# Patient Record
Sex: Male | Born: 1955 | Race: White | Hispanic: No | Smoking: Current every day smoker
Health system: Southern US, Community
[De-identification: ages and names within clinical notes are randomized; demographics above are authoritative.]

## PROBLEM LIST (undated history)

## (undated) DIAGNOSIS — H269 Unspecified cataract: Secondary | ICD-10-CM

## (undated) DIAGNOSIS — I10 Essential (primary) hypertension: Secondary | ICD-10-CM

## (undated) DIAGNOSIS — K219 Gastro-esophageal reflux disease without esophagitis: Secondary | ICD-10-CM

## (undated) DIAGNOSIS — H3320 Serous retinal detachment, unspecified eye: Secondary | ICD-10-CM

## (undated) DIAGNOSIS — M25522 Pain in left elbow: Secondary | ICD-10-CM

## (undated) HISTORY — PX: LEG SURGERY: SHX1003

## (undated) HISTORY — DX: Unspecified cataract: H26.9

## (undated) HISTORY — DX: Gastro-esophageal reflux disease without esophagitis: K21.9

## (undated) HISTORY — DX: Pain in left elbow: M25.522

## (undated) HISTORY — PX: CATARACT EXTRACTION: SUR2

## (undated) HISTORY — PX: EYE SURGERY: SHX253

## (undated) HISTORY — DX: Essential (primary) hypertension: I10

---

## 2002-11-13 ENCOUNTER — Encounter: Payer: Self-pay | Admitting: Occupational Medicine

## 2002-11-13 ENCOUNTER — Encounter: Admission: RE | Admit: 2002-11-13 | Discharge: 2002-11-13 | Payer: Self-pay | Admitting: Occupational Medicine

## 2004-09-18 ENCOUNTER — Ambulatory Visit (HOSPITAL_COMMUNITY): Admission: RE | Admit: 2004-09-18 | Discharge: 2004-09-18 | Payer: Self-pay | Admitting: Orthopedic Surgery

## 2005-07-23 ENCOUNTER — Ambulatory Visit (HOSPITAL_COMMUNITY): Admission: RE | Admit: 2005-07-23 | Discharge: 2005-07-23 | Payer: Self-pay | Admitting: Ophthalmology

## 2008-06-24 ENCOUNTER — Encounter (INDEPENDENT_AMBULATORY_CARE_PROVIDER_SITE_OTHER): Payer: Self-pay | Admitting: *Deleted

## 2008-06-25 ENCOUNTER — Encounter: Payer: Self-pay | Admitting: Family Medicine

## 2008-06-25 ENCOUNTER — Ambulatory Visit: Payer: Self-pay | Admitting: Family Medicine

## 2008-06-25 DIAGNOSIS — R61 Generalized hyperhidrosis: Secondary | ICD-10-CM

## 2008-06-25 DIAGNOSIS — R030 Elevated blood-pressure reading, without diagnosis of hypertension: Secondary | ICD-10-CM

## 2008-06-25 DIAGNOSIS — J309 Allergic rhinitis, unspecified: Secondary | ICD-10-CM | POA: Insufficient documentation

## 2008-06-25 DIAGNOSIS — R1310 Dysphagia, unspecified: Secondary | ICD-10-CM | POA: Insufficient documentation

## 2008-06-25 HISTORY — DX: Elevated blood-pressure reading, without diagnosis of hypertension: R03.0

## 2008-06-25 HISTORY — DX: Generalized hyperhidrosis: R61

## 2008-06-28 ENCOUNTER — Encounter (INDEPENDENT_AMBULATORY_CARE_PROVIDER_SITE_OTHER): Payer: Self-pay | Admitting: *Deleted

## 2008-07-06 ENCOUNTER — Ambulatory Visit: Payer: Self-pay | Admitting: Family Medicine

## 2008-07-13 ENCOUNTER — Ambulatory Visit: Payer: Self-pay | Admitting: Gastroenterology

## 2008-07-13 DIAGNOSIS — R197 Diarrhea, unspecified: Secondary | ICD-10-CM | POA: Insufficient documentation

## 2008-07-13 DIAGNOSIS — R1319 Other dysphagia: Secondary | ICD-10-CM | POA: Insufficient documentation

## 2008-07-13 DIAGNOSIS — K921 Melena: Secondary | ICD-10-CM | POA: Insufficient documentation

## 2008-07-13 DIAGNOSIS — K219 Gastro-esophageal reflux disease without esophagitis: Secondary | ICD-10-CM | POA: Insufficient documentation

## 2008-07-13 HISTORY — DX: Melena: K92.1

## 2008-07-13 HISTORY — DX: Other dysphagia: R13.19

## 2008-07-13 HISTORY — DX: Diarrhea, unspecified: R19.7

## 2008-07-22 ENCOUNTER — Ambulatory Visit: Payer: Self-pay | Admitting: Family Medicine

## 2008-07-22 DIAGNOSIS — R011 Cardiac murmur, unspecified: Secondary | ICD-10-CM

## 2008-07-22 DIAGNOSIS — F17200 Nicotine dependence, unspecified, uncomplicated: Secondary | ICD-10-CM

## 2008-08-02 ENCOUNTER — Encounter: Payer: Self-pay | Admitting: Family Medicine

## 2008-08-02 ENCOUNTER — Ambulatory Visit: Payer: Self-pay

## 2008-08-04 ENCOUNTER — Encounter: Payer: Self-pay | Admitting: Gastroenterology

## 2008-08-04 ENCOUNTER — Ambulatory Visit: Payer: Self-pay | Admitting: Gastroenterology

## 2008-08-06 ENCOUNTER — Encounter: Payer: Self-pay | Admitting: Gastroenterology

## 2008-08-14 ENCOUNTER — Encounter: Payer: Self-pay | Admitting: Gastroenterology

## 2010-07-07 NOTE — Op Note (Signed)
Miguel Mills, Miguel Mills           ACCOUNT NO.:  [id number]   MEDICAL RECORD NO.:  [id number]          PATIENT TYPE:  AMB   LOCATION:  SDS                          FACILITY:  [facility]   PHYSICIAN:  Alford Highland. Rankin, M.D.   DATE OF BIRTH:  1956-01-15   DATE OF PROCEDURE:  07/23/2005  DATE OF DISCHARGE:                                 OPERATIVE REPORT   PREOPERATIVE DIAGNOSIS:  Rhegmatogenous retinal detachment, left eye, macula  on.   POSTOPERATIVE DIAGNOSIS:  Rhegmatogenous retinal detachment, left eye,  macula on.   PROCEDURES:  1.  Scleral buckle and retinal cryopexy, left eye.  2.  External drainage of subretinal fluid, left eye.   SURGEON:  Alford Highland. Rankin, M.D.   ANESTHESIA:  General endotracheal anesthesia.   INDICATION FOR PROCEDURE:  The patient is a 55 year old man who has profound  vision loss on the basis of a rhegmatogenous retinal detachment with  superior visual field loss.  This is actually asymptomatic and found on a  routine eye examination.   The patient understands the need for surgical intervention in order to  prevent vision loss and preserve current visual function.  He understands  the risks of anesthesia, including the rare occurrence of death, and losses  to the eye, including but not limited to hemorrhage, infection, scarring,  need for further surgery, no change in vision, loss of vision, and  progressive disease despite intervention.   An appropriate signed consent was obtained.  The patient was taken to the  operating room.  In the operating room appropriate monitoring was followed  by mild sedation.  General endotracheal anesthesia was instituted without  difficulty.  The left periocular region was sterilely prepped and draped in  the usual ophthalmic fashion.  A lid speculum applied.  A conjunctival  peritomy was then fashioned 360 degrees.  A relaxing incision was made in  two quadrants.  Ophthalmoscopy was then performed __________ the retinal  hole at the 5 o'clock position.  Retinal cryopexy was applied in this area.  The retinal detachment extended from the 3 o'clock to the 6:30 position.  A  287 solid silicone explant was then selected and placed in the  inferotemporal quadrant.  A 240 encircling band was used and joined end-to-  end in the superonasal quadrant with a 70 Watzke sleeve.   Appropriate tension was applied.  External drainage of subretinal fluid was  carried out in the bed of the buckle.   At this time all fluid was drained without difficulty under direct  visualization.   No complications occurred.  The buckle was then secured with horizontal  mattress sutures, 2 in the inferotemporal quadrant, 1 in the remaining  quadrants.   At this time appropriate tension was applied.  The bed of the buckle was  irrigated with bug juice.  At that time the Tenon's brought forward and  closed with interrupted 7-0 Vicryl.  Subconjunctival injection of antibiotic  and steroid applied.  Sterile patch and Fox shield applied.  The patient  awakened from anesthesia and was taken to the recovery room in good, stable  condition.  No  complication occurred.      Alford Highland Rankin, M.D.  Electronically Signed     GAR/MEDQ  D:  07/23/2005  T:  07/24/2005  Job:  [id number]   cc:   Baldo Ash, O.D.

## 2011-05-23 ENCOUNTER — Encounter: Payer: Self-pay | Admitting: Gastroenterology

## 2012-05-23 ENCOUNTER — Encounter: Payer: Self-pay | Admitting: Gastroenterology

## 2014-07-13 ENCOUNTER — Encounter: Payer: Self-pay | Admitting: Gastroenterology

## 2015-11-26 ENCOUNTER — Encounter (HOSPITAL_COMMUNITY): Payer: Self-pay | Admitting: *Deleted

## 2015-11-26 ENCOUNTER — Emergency Department (HOSPITAL_COMMUNITY)
Admission: EM | Admit: 2015-11-26 | Discharge: 2015-11-26 | Disposition: A | Discharge status: Home / Self Care | Payer: BLUE CROSS/BLUE SHIELD | Attending: Emergency Medicine | Admitting: Emergency Medicine

## 2015-11-26 ENCOUNTER — Emergency Department (HOSPITAL_COMMUNITY): Payer: BLUE CROSS/BLUE SHIELD

## 2015-11-26 DIAGNOSIS — J069 Acute upper respiratory infection, unspecified: Secondary | ICD-10-CM | POA: Insufficient documentation

## 2015-11-26 DIAGNOSIS — F17200 Nicotine dependence, unspecified, uncomplicated: Secondary | ICD-10-CM | POA: Insufficient documentation

## 2015-11-26 DIAGNOSIS — R0602 Shortness of breath: Secondary | ICD-10-CM | POA: Diagnosis present

## 2015-11-26 HISTORY — DX: Serous retinal detachment, unspecified eye: H33.20

## 2015-11-26 LAB — BASIC METABOLIC PANEL
Anion gap: 13 (ref 5–15)
CALCIUM: 9.7 mg/dL (ref 8.9–10.3)
CHLORIDE: 102 mmol/L (ref 101–111)
CO2: 24 mmol/L (ref 22–32)
CREATININE: 0.63 mg/dL (ref 0.61–1.24)
GFR calc non Af Amer: >60 mL/min (ref >60)
GLUCOSE: 116 mg/dL — AB (ref 65–99)
Potassium: 3.7 mmol/L (ref 3.5–5.1)
Sodium: 139 mmol/L (ref 135–145)

## 2015-11-26 LAB — CBC
HCT: 44 % (ref 39.0–52.0)
Hemoglobin: 15.3 g/dL (ref 13.0–17.0)
MCH: 31.7 pg (ref 26.0–34.0)
MCHC: 34.8 g/dL (ref 30.0–36.0)
MCV: 91.3 fL (ref 78.0–100.0)
PLATELETS: 245 10*3/uL (ref 150–400)
RBC: 4.82 MIL/uL (ref 4.22–5.81)
RDW: 12.8 % (ref 11.5–15.5)
WBC: 6.8 10*3/uL (ref 4.0–10.5)

## 2015-11-26 LAB — I-STAT TROPONIN, ED: Troponin i, poc: 0.01 ng/mL (ref 0.00–0.08)

## 2015-11-26 MED ORDER — DM-GUAIFENESIN ER 30-600 MG PO TB12
1.0000 | ORAL_TABLET | Freq: Two times a day (BID) | ORAL | Status: DC
  Administered 2015-11-26: 1 via ORAL
  Filled 2015-11-26: qty 1

## 2015-11-26 MED ORDER — AZITHROMYCIN 250 MG PO TABS
500.0000 mg | ORAL_TABLET | Freq: Every day | ORAL | Status: DC
  Administered 2015-11-26: 500 mg via ORAL
  Filled 2015-11-26: qty 2

## 2015-11-26 MED ORDER — AZITHROMYCIN 250 MG PO TABS: 250.0000 mg | ORAL_TABLET | Freq: Every day | ORAL | 0 refills | Status: DC

## 2015-11-26 MED ORDER — ALBUTEROL SULFATE HFA 108 (90 BASE) MCG/ACT IN AERS: 1.0000 | INHALATION_SPRAY | RESPIRATORY_TRACT | Status: DC | PRN

## 2015-11-26 NOTE — ED Notes (Signed)
Pt stable, ambulatory, states understanding of discharge instructions 

## 2015-11-26 NOTE — ED Provider Notes (Signed)
[facility] DEPT Provider Note   CSN: [id number] Arrival date & time: 11/26/15  0053  History   Chief Complaint Chief Complaint  Patient presents with  . Nasal Congestion  . Shortness of Breath    HPI Miguel Mills is a 60 y.o. male.  HPI   Dysphagia, diarrhea, tobacco, systolic murmur, GERD, rhinitis, night swears, elevated BP, elevated BP wo hypertension:  Patient to the ER for evaluating of nasal congestion, cough, and chest congestion with some mild SOB for 2 days. He says that he come to the ER tonight because he started coughing and was unable to stop. He says that while he was in the waiting room his coughing eventually subsided and improved. He is now just complaining of nasal congestion, He no longer has shortness of breath or congestion.  He denies fevers, chills, weight loss, bone aches, back pain, headache, CP, confusion, LE swelling, or night sweats.  Past Medical History:  Diagnosis Date  . Retinal detachment     Patient Active Problem List   Diagnosis Date Noted  . TOBACCO USE 07/22/2008  . SYSTOLIC MURMUR [id number]  . GERD 07/13/2008  . BLOOD IN STOOL 07/13/2008  . OTHER DYSPHAGIA 07/13/2008  . DIARRHEA 07/13/2008  . RHINITIS 06/25/2008  . NIGHT SWEATS 06/25/2008  . DYSPHAGIA UNSPECIFIED 06/25/2008  . ELEVATED BP W/O HYPERTENSION 06/25/2008    Past Surgical History:  Procedure Laterality Date  . EYE SURGERY    . LEG SURGERY         Home Medications    Prior to Admission medications   Medication Sig Start Date End Date Taking? Authorizing Provider  azithromycin (ZITHROMAX) 250 MG tablet Take 1 tablet (250 mg total) by mouth daily. Take first 2 tablets together, then 1 every day until finished. 11/26/15   Delos Haring, PA-C    Family History No family history on file.  Social History Social History  Substance Use Topics  . Smoking status: Current Every Day Smoker  . Smokeless tobacco: Never Used  . Alcohol use Yes      Allergies   Review of patient's allergies indicates no known allergies.   Review of Systems Review of Systems  Review of Systems All other systems negative except as documented in the HPI. All pertinent positives and negatives as reviewed in the HPI.  Physical Exam Updated Vital Signs BP 129/90   Pulse 95   Temp 98.7 F (37.1 C) (Oral)   Resp 15   SpO2 96%   Physical Exam  Constitutional: He appears well-developed and well-nourished. No distress.  HENT:  Head: Normocephalic and atraumatic.  Right Ear: Tympanic membrane and ear canal normal.  Left Ear: Tympanic membrane and ear canal normal.  Nose: Nose normal.  Mouth/Throat: Uvula is midline, oropharynx is clear and moist and mucous membranes are normal.  Eyes: Pupils are equal, round, and reactive to light.  Neck: Normal range of motion. Neck supple.  Cardiovascular: Normal rate and regular rhythm.   Pulmonary/Chest: Effort normal.  Abdominal: Soft.  No signs of abdominal distention  Musculoskeletal:  No LE swelling  Neurological: He is alert.  Acting at baseline  Skin: Skin is warm and dry. No rash noted.  Nursing note and vitals reviewed.    ED Treatments / Results  Labs (all labs ordered are listed, but only abnormal results are displayed) Labs Reviewed  BASIC METABOLIC PANEL - Abnormal; Notable for the following:       Result Value   Glucose, Bld 116 (*)  BUN <5 (*)    All other components within normal limits  CBC  I-STAT TROPOININ, ED    EKG  EKG Interpretation None       Radiology Dg Chest 2 View  Result Date: 11/26/2015 CLINICAL DATA:  Cough, congestion, and shortness of breath for 2 days. Smoker. EXAM: CHEST  2 VIEW COMPARISON:  06/25/2008 FINDINGS: Emphysematous changes and scattered fibrosis in the lungs. No focal airspace disease or consolidation. Normal heart size and pulmonary vascularity. No blunting of costophrenic angles. No pneumothorax. Old left rib fractures. IMPRESSION:  Emphysematous changes and fibrosis in the lungs. No evidence of active pulmonary disease. Electronically Signed   By: Lucienne Capers M.D.   On: 11/26/2015 02:01    Procedures Procedures (including critical care time)  Medications Ordered in ED Medications  albuterol (PROVENTIL HFA;VENTOLIN HFA) 108 (90 Base) MCG/ACT inhaler 1 puff (not administered)  azithromycin (ZITHROMAX) tablet 500 mg (not administered)  dextromethorphan-guaiFENesin (MUCINEX DM) 30-600 MG per 12 hr tablet 1 tablet (not administered)     Initial Impression / Assessment and Plan / ED Course  I have reviewed the triage vital signs and the nursing notes.  Pertinent labs & imaging results that were available during my care of the patient were reviewed by me and considered in my medical decision making (see chart for details).  Clinical Course    Pt denies having hx of emphysema, his chest xray shows these changes today. Discussed this with him and that he needs to get plugged into a primary care provider for further evaluation.  Will give decongestant, ABX and albuterol inhaler, pt is well appearing, discussed return precautions.  Final Clinical Impressions(s) / ED Diagnoses   Final diagnoses:  Upper respiratory tract infection, unspecified type    New Prescriptions New Prescriptions   AZITHROMYCIN (ZITHROMAX) 250 MG TABLET    Take 1 tablet (250 mg total) by mouth daily. Take first 2 tablets together, then 1 every day until finished.     Delos Haring, PA-C 11/26/15 [id number]    Lacretia Leigh, MD 11/27/15 [phone]

## 2015-11-26 NOTE — ED Triage Notes (Signed)
Pt is traveling to [place], has had cold like symptoms and congestion x 1 week. Reports difficulty breathing x 2 days. Denies chest pain

## 2015-11-26 NOTE — ED Notes (Signed)
Pt decided not to leave. He has stayed in the waiting room.

## 2016-06-05 ENCOUNTER — Ambulatory Visit (HOSPITAL_COMMUNITY)
Admission: EM | Admit: 2016-06-05 | Discharge: 2016-06-05 | Disposition: A | Discharge status: Home / Self Care | Payer: BLUE CROSS/BLUE SHIELD | Attending: Internal Medicine | Admitting: Internal Medicine

## 2016-06-05 ENCOUNTER — Encounter (HOSPITAL_COMMUNITY): Payer: Self-pay | Admitting: Emergency Medicine

## 2016-06-05 DIAGNOSIS — B9789 Other viral agents as the cause of diseases classified elsewhere: Secondary | ICD-10-CM

## 2016-06-05 DIAGNOSIS — J069 Acute upper respiratory infection, unspecified: Secondary | ICD-10-CM | POA: Diagnosis not present

## 2016-06-05 DIAGNOSIS — J302 Other seasonal allergic rhinitis: Secondary | ICD-10-CM

## 2016-06-05 MED ORDER — FLUTICASONE PROPIONATE 50 MCG/ACT NA SUSP: 2.0000 | Freq: Every day | NASAL | 2 refills | Status: DC

## 2016-06-05 MED ORDER — MONTELUKAST SODIUM 10 MG PO TABS: 10.0000 mg | ORAL_TABLET | Freq: Every day | ORAL | 2 refills | Status: DC

## 2016-06-05 MED ORDER — BENZONATATE 100 MG PO CAPS: 100.0000 mg | ORAL_CAPSULE | Freq: Three times a day (TID) | ORAL | 0 refills | Status: DC

## 2016-06-05 NOTE — ED Triage Notes (Signed)
On thursday felt weak, Friday had diarrhea and congestion in chest.  Seemed to improve over the week end.  Patient continues with congestion in chest and cough

## 2016-06-05 NOTE — Discharge Instructions (Signed)
You most likely have a viral URI, I advise rest, plenty of fluids and management of symptoms with over the counter medicines. For symptoms you may take Tylenol as needed every 4-6 hours for body aches or fever, not to exceed 4,000 mg a day, Take mucinex or mucinex DM ever 12 hours with a full glass of water, you may use an inhaled steroid such as Flonase, 2 sprays each nostril once a day for congestion, or an antihistamine such as Claritin or Zyrtec once a day. For cough, I have prescribed a medication called Tessalon. Take 1 tablet every 8 hours as needed for your cough. Should your symptoms worsen or fail to resolve, follow up with your primary care provider or return to clinic.   For your allergies, I have prescribed Singulair, take one tablet every night at bedtime.

## 2016-06-05 NOTE — ED Provider Notes (Signed)
CSN: [id number]     Arrival date & time 06/05/16  1123 History   First MD Initiated Contact with Patient 06/05/16 1146     Chief Complaint  Patient presents with  . URI   (Consider location/radiation/quality/duration/timing/severity/associated sxs/prior Treatment)  URI  Presenting symptoms: congestion, cough and rhinorrhea   Presenting symptoms: no fever and no sore throat   Congestion:    Location:  Nasal   Interferes with sleep: yes     Interferes with eating/drinking: no   Cough:    Cough characteristics:  Non-productive, dry and hacking   Sputum characteristics:  Clear   Severity:  Moderate   Onset quality:  Gradual   Duration:  4 days   Timing:  Intermittent   Progression:  Waxing and waning   Chronicity:  New Severity:  Mild Onset quality:  Gradual Timing:  Constant Progression:  Unchanged Chronicity:  New Relieved by:  OTC medications and decongestant Worsened by:  Nothing Associated symptoms: sneezing   Associated symptoms: no arthralgias, no headaches, no myalgias, no neck pain, no sinus pain, no swollen glands and no wheezing     Past Medical History:  Diagnosis Date  . Retinal detachment    Past Surgical History:  Procedure Laterality Date  . EYE SURGERY    . LEG SURGERY     No family history on file. Social History  Substance Use Topics  . Smoking status: Current Every Day Smoker  . Smokeless tobacco: Never Used  . Alcohol use Yes    Review of Systems  Constitutional: Negative for chills and fever.  HENT: Positive for congestion, rhinorrhea and sneezing. Negative for sinus pain and sore throat.   Eyes: Positive for redness and itching.  Respiratory: Positive for cough. Negative for shortness of breath and wheezing.   Cardiovascular: Negative for chest pain and palpitations.  Gastrointestinal: Negative.   Genitourinary: Negative.   Musculoskeletal: Negative for arthralgias, myalgias and neck pain.  Skin: Negative.   Neurological: Negative for  dizziness, light-headedness and headaches.    Allergies  Patient has no known allergies.  Home Medications   Prior to Admission medications   Medication Sig Start Date End Date Taking? Authorizing Provider  benzonatate (TESSALON) 100 MG capsule Take 1 capsule (100 mg total) by mouth every 8 (eight) hours. 06/05/16   Barnet Glasgow, NP  fluticasone (FLONASE) 50 MCG/ACT nasal spray Place 2 sprays into both nostrils daily. 06/05/16   Barnet Glasgow, NP  montelukast (SINGULAIR) 10 MG tablet Take 1 tablet (10 mg total) by mouth at bedtime. 06/05/16   Barnet Glasgow, NP   Meds Ordered and Administered this Visit  Medications - No data to display  BP (!) 147/90 (BP Location: Left Arm)   Pulse 86   Temp 98.9 F (37.2 C) (Oral)   Resp 18   SpO2 99%  No data found.   Physical Exam  Constitutional: He is oriented to person, place, and time. He appears well-developed and well-nourished. No distress.  HENT:  Head: Normocephalic and atraumatic.  Right Ear: Tympanic membrane and external ear normal.  Left Ear: Tympanic membrane and external ear normal.  Nose: Nose normal. Right sinus exhibits no maxillary sinus tenderness and no frontal sinus tenderness. Left sinus exhibits no maxillary sinus tenderness and no frontal sinus tenderness.  Mouth/Throat: Uvula is midline and oropharynx is clear and moist. No oropharyngeal exudate.  Eyes: Conjunctivae are normal. Right eye exhibits no discharge. Left eye exhibits no discharge.  Neck: Normal range of motion. Neck supple. No  JVD present.  Cardiovascular: Normal rate and regular rhythm.   Pulmonary/Chest: Effort normal and breath sounds normal. No respiratory distress. He has no wheezes.  Abdominal: Soft. Bowel sounds are normal.  Lymphadenopathy:    He has no cervical adenopathy.  Neurological: He is alert and oriented to person, place, and time.  Skin: Skin is warm and dry. Capillary refill takes less than 2 seconds. No rash noted. He is not  diaphoretic. No erythema.  Psychiatric: He has a normal mood and affect. His behavior is normal.  Nursing note and vitals reviewed.   Urgent Care Course     Procedures (including critical care time)  Labs Review Labs Reviewed - No data to display  Imaging Review No results found.    MDM   1. Viral URI with cough   2. Seasonal allergic rhinitis, unspecified trigger    Counseling provided on over-the-counter therapies for symptom relief. Given prescription for Tessalon for cough, Flonase and Singulair for allergies. Recommend rest, plenty of fluids, follow-up with primary care if symptoms persist.     Barnet Glasgow, NP 06/05/16 1205

## 2018-04-19 ENCOUNTER — Encounter (HOSPITAL_COMMUNITY): Payer: Self-pay | Admitting: Emergency Medicine

## 2018-04-19 ENCOUNTER — Ambulatory Visit (HOSPITAL_COMMUNITY)
Admission: EM | Admit: 2018-04-19 | Discharge: 2018-04-19 | Disposition: A | Discharge status: Home / Self Care | Payer: BLUE CROSS/BLUE SHIELD | Attending: Family Medicine | Admitting: Family Medicine

## 2018-04-19 DIAGNOSIS — J309 Allergic rhinitis, unspecified: Secondary | ICD-10-CM | POA: Diagnosis not present

## 2018-04-19 DIAGNOSIS — S161XXA Strain of muscle, fascia and tendon at neck level, initial encounter: Secondary | ICD-10-CM | POA: Diagnosis not present

## 2018-04-19 DIAGNOSIS — R030 Elevated blood-pressure reading, without diagnosis of hypertension: Secondary | ICD-10-CM

## 2018-04-19 MED ORDER — MELOXICAM 7.5 MG PO TABS: 7.5000 mg | ORAL_TABLET | Freq: Every day | ORAL | 0 refills | Status: DC

## 2018-04-19 MED ORDER — METHOCARBAMOL 500 MG PO TABS
500.0000 mg | ORAL_TABLET | Freq: Two times a day (BID) | ORAL | 0 refills | Status: DC
Start: 2018-04-19 — End: 2021-03-15

## 2018-04-19 MED ORDER — FLUTICASONE PROPIONATE 50 MCG/ACT NA SUSP: 2.0000 | Freq: Every day | NASAL | 2 refills | Status: DC

## 2018-04-19 NOTE — Discharge Instructions (Signed)
Start Mobic. Do not take ibuprofen (motrin/advil)/ naproxen (aleve) while on mobic. Robaxin as needed, this can make you drowsy, so do not take if you are going to drive, operate heavy machinery, or make important decisions. Ice/heat compresses as needed. This can take up to 3-4 weeks to completely resolve, but you should be feeling better each week. Follow up here or with PCP if symptoms worsen, changes for reevaluation.

## 2018-04-19 NOTE — ED Triage Notes (Signed)
Pt states last week he turned his neck while at work really fast trying to look at something, states Wednesday morning he woke up with L sided neck pain and tenderness, difficulty turning his head.

## 2018-04-19 NOTE — ED Provider Notes (Signed)
[facility]    CSN: [id number] Arrival date & time: 04/19/18  1004     History   Chief Complaint Chief Complaint  Patient presents with  . Neck Pain    HPI Miguel Mills is a 63 y.o. male.   63 year old male comes in for few day history of left neck pain. Patient states about 1 week ago, turned his neck really fast when he thought something was falling from the top of a shelf. He had no pain at that time. However, a few days ago, started having gradual neck pain that is causing stiffness and difficulty turning. He states pain can be both left and right neck at the time. He denies numbness/tingling, loss of grip strength. He has since been taking ibuprofen intermittently with some relief. He states now able to have full ROM of neck, though with pain when he turns his head.      Past Medical History:  Diagnosis Date  . Retinal detachment     Patient Active Problem List   Diagnosis Date Noted  . TOBACCO USE 07/22/2008  . SYSTOLIC MURMUR 83/41/9622  . GERD 07/13/2008  . BLOOD IN STOOL 07/13/2008  . OTHER DYSPHAGIA 07/13/2008  . DIARRHEA 07/13/2008  . RHINITIS 06/25/2008  . NIGHT SWEATS 06/25/2008  . DYSPHAGIA UNSPECIFIED 06/25/2008  . ELEVATED BP W/O HYPERTENSION 06/25/2008    Past Surgical History:  Procedure Laterality Date  . EYE SURGERY    . LEG SURGERY         Home Medications    Prior to Admission medications   Medication Sig Start Date End Date Taking? Authorizing Provider  fluticasone (FLONASE) 50 MCG/ACT nasal spray Place 2 sprays into both nostrils daily. 04/19/18   Tasia Catchings, Brandyce Dimario V, PA-C  meloxicam (MOBIC) 7.5 MG tablet Take 1 tablet (7.5 mg total) by mouth daily. 04/19/18   Tasia Catchings, Nashaly Dorantes V, PA-C  methocarbamol (ROBAXIN) 500 MG tablet Take 1 tablet (500 mg total) by mouth 2 (two) times daily. 04/19/18   Tasia Catchings, Diala Waxman V, PA-C  montelukast (SINGULAIR) 10 MG tablet Take 1 tablet (10 mg total) by mouth at bedtime. 06/05/16   Barnet Glasgow, NP    Family  History No family history on file.  Social History Social History   Tobacco Use  . Smoking status: Current Every Day Smoker  . Smokeless tobacco: Never Used  Substance Use Topics  . Alcohol use: Yes  . Drug use: No     Allergies   Patient has no known allergies.   Review of Systems Review of Systems  Reason unable to perform ROS: See HPI as above.     Physical Exam Triage Vital Signs ED Triage Vitals  Enc Vitals Group     BP 04/19/18 1022 (!) 184/97     Pulse Rate 04/19/18 1022 77     Resp 04/19/18 1022 18     Temp 04/19/18 1022 98.4 F (36.9 C)     Temp src --      SpO2 04/19/18 1022 99 %     Weight --      Height --      Head Circumference --      Peak Flow --      Pain Score 04/19/18 1024 0     Pain Loc --      Pain Edu? --      Excl. in [place]? --    No data found.  Updated Vital Signs BP (!) 184/97   Pulse 77  Temp 98.4 F (36.9 C)   Resp 18   SpO2 99%   Physical Exam Constitutional:      General: He is not in acute distress.    Appearance: He is well-developed. He is not ill-appearing, toxic-appearing or diaphoretic.  HENT:     Head: Normocephalic and atraumatic.  Eyes:     Conjunctiva/sclera: Conjunctivae normal.     Pupils: Pupils are equal, round, and reactive to light.  Neck:     Comments: No tenderness to palpation of spinous processes. Tenderness to palpation of left neck. Full ROM of neck. Strength normal and equal bilaterally.  Musculoskeletal:     Comments: No tenderness to palpation of spinous processes. No tenderness to palpation of bilateral shoulders/thoracic back. Full ROM of shoulders. Strength normal and equal bilaterally. Grip strength normal and equal bilaterally. Sensation intact and equal bilaterally. Radial pulse 2+, cap refill <2s  Neurological:     Mental Status: He is alert and oriented to person, place, and time.      UC Treatments / Results  Labs (all labs ordered are listed, but only abnormal results are  displayed) Labs Reviewed - No data to display  EKG None  Radiology No results found.  Procedures Procedures (including critical care time)  Medications Ordered in UC Medications - No data to display  Initial Impression / Assessment and Plan / UC Course  I have reviewed the triage vital signs and the nursing notes.  Pertinent labs & imaging results that were available during my care of the patient were reviewed by me and considered in my medical decision making (see chart for details).    Start NSAID as directed for pain and inflammation. Muscle relaxant as needed. Ice/heat compresses. Discussed with patient strain can take up to 3-4 weeks to resolve, but should be getting better each week. Return precautions given.   Patient would like his flonase refilled. States has had good relief for his allergic rhinitis. He states with Rx may be cheaper than over the counter. Refills sent to pharmacy.  Final Clinical Impressions(s) / UC Diagnoses   Final diagnoses:  Strain of neck muscle, initial encounter   ED Prescriptions    Medication Sig Dispense Auth. Provider   meloxicam (MOBIC) 7.5 MG tablet Take 1 tablet (7.5 mg total) by mouth daily. 15 tablet Hartleigh Edmonston V, PA-C   methocarbamol (ROBAXIN) 500 MG tablet Take 1 tablet (500 mg total) by mouth 2 (two) times daily. 20 tablet Nanami Whitelaw V, PA-C   fluticasone (FLONASE) 50 MCG/ACT nasal spray Place 2 sprays into both nostrils daily. 15.8 g Miguel Mills, [place] 04/19/18 1112

## 2018-07-28 ENCOUNTER — Ambulatory Visit (HOSPITAL_COMMUNITY)
Admission: EM | Admit: 2018-07-28 | Discharge: 2018-07-28 | Disposition: A | Discharge status: Home / Self Care | Payer: BLUE CROSS/BLUE SHIELD | Attending: Family Medicine | Admitting: Family Medicine

## 2018-07-28 ENCOUNTER — Other Ambulatory Visit: Payer: Self-pay

## 2018-07-28 ENCOUNTER — Encounter (HOSPITAL_COMMUNITY): Payer: Self-pay

## 2018-07-28 DIAGNOSIS — M25532 Pain in left wrist: Secondary | ICD-10-CM | POA: Diagnosis not present

## 2018-07-28 DIAGNOSIS — R030 Elevated blood-pressure reading, without diagnosis of hypertension: Secondary | ICD-10-CM

## 2018-07-28 MED ORDER — NAPROXEN 375 MG PO TABS: 375.0000 mg | ORAL_TABLET | Freq: Two times a day (BID) | ORAL | 0 refills | Status: DC

## 2018-07-28 NOTE — ED Provider Notes (Signed)
[place]   [id number] 07/28/18 Arrival Time: 2355  ASSESSMENT & PLAN:  1. Left wrist pain    Inflammatory. No indication for plain imaging of wrist at this time. No trauma.  Meds ordered this encounter  Medications  . naproxen (NAPROSYN) 375 MG tablet    Sig: Take 1 tablet (375 mg total) by mouth 2 (two) times daily with a meal.    Dispense:  14 tablet    Refill:  0   Orders Placed This Encounter  Procedures  . Splint wrist   To wear splint regularly for the next several days to one week. Remove to shower. Once feeling a little better will remove for short periods of time to work on wrist ROM.  Follow-up Information    Roseanne Kaufman, MD.   Specialty:  Orthopedic Surgery Why:  If not improving over the next week. Contact information: [address] [place] [address] [phone]          Work note with restrictions provided.  Reviewed expectations re: course of current medical issues. Questions answered. Outlined signs and symptoms indicating need for more acute intervention. Patient verbalized understanding. After Visit Summary given.  SUBJECTIVE: History from: patient. Miguel Mills is a 63 y.o. male who reports fairly persistent mild to moderate pain of his left wrist; described as aching and dull; occasional sharp exacerbation; without radiation. Onset: gradual, first noticed yesterday morning after waking. 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 Symptoms have progressed to a point and plateaued since beginning. Aggravating factors: movement. Alleviating factors: holding still. Associated symptoms: none reported. Extremity sensation changes or weakness: none. Self treatment: has not tried OTCs for relief of pain. History of similar: no.  Past Surgical History:  Procedure Laterality Date  . EYE SURGERY    . LEG SURGERY      ROS: As per HPI.  All other systems negative.   OBJECTIVE:  Vitals:   07/28/18 0905  BP: (!) 157/97  Pulse: 90  Resp: 16  Temp: 97.9 F (36.6 C)  TempSrc: Oral  SpO2: 97%    General appearance: alert; no distress HEENT: [place]; AT Neck: supple with FROM Resp: unlabored respirations Extremities: . LUE: warm and well perfused; poorly localized moderate tenderness over left wrist; without gross deformities; with no swelling; with no bruising; ROM: normal with reported discomfort (more discomfort with wrist extension) CV: brisk extremity capillary refill of LUE; 2+ radial pulse of LUE. Skin: warm and dry; no visible rashes Neurologic: gait normal; normal reflexes of RUE and LUE; normal sensation of RUE and LUE; normal strength of RUE and LUE Psychological: alert and cooperative; normal mood and affect  No Known Allergies  Past Medical History:  Diagnosis Date  . Retinal detachment    Social History   Socioeconomic History  . Marital status: Divorced    Spouse name: Not on file  . Number of children: Not on file  . Years of education: Not on file  . Highest education level: Not on file  Occupational History  . Not on file  Social Needs  . Financial resource strain: Not on file  . Food insecurity:    Worry: Not on file    Inability: Not on file  . Transportation needs:    Medical: Not on file    Non-medical: Not on file  Tobacco Use  . Smoking status: Current Every Day Smoker  . Smokeless tobacco: Never Used  Substance and Sexual Activity  . Alcohol use: Yes  . Drug use: No  . Sexual activity: Not on file  Lifestyle  . Physical activity:    Days per week: Not on file    Minutes per session: Not on file  . Stress: Not on file  Relationships  . Social connections:    Talks on phone: Not on file    Gets together: Not on file    Attends religious service: Not on file    Active member of club or organization: Not on file    Attends meetings of clubs or organizations: Not on file     Relationship status: Not on file  Other Topics Concern  . Not on file  Social History Narrative  . Not on file     Past Surgical History:  Procedure Laterality Date  . EYE SURGERY    . LEG SURGERY        Vanessa Kick, MD 07/28/18 1014

## 2018-07-28 NOTE — ED Triage Notes (Signed)
Pt presents with left hand and wrist pain from unknown source.

## 2020-08-06 ENCOUNTER — Encounter (HOSPITAL_COMMUNITY): Payer: Self-pay

## 2020-08-06 ENCOUNTER — Ambulatory Visit (HOSPITAL_COMMUNITY)
Admission: EM | Admit: 2020-08-06 | Discharge: 2020-08-06 | Disposition: A | Discharge status: Home / Self Care | Payer: Managed Care, Other (non HMO) | Attending: Family Medicine | Admitting: Family Medicine

## 2020-08-06 DIAGNOSIS — R030 Elevated blood-pressure reading, without diagnosis of hypertension: Secondary | ICD-10-CM

## 2020-08-06 DIAGNOSIS — B029 Zoster without complications: Secondary | ICD-10-CM

## 2020-08-06 MED ORDER — VALACYCLOVIR HCL 1 G PO TABS: 1000.0000 mg | ORAL_TABLET | Freq: Three times a day (TID) | ORAL | 0 refills | Status: DC

## 2020-08-06 NOTE — Discharge Instructions (Addendum)
Your blood pressure was noted to be elevated during your visit today. If you are currently taking medication for high blood pressure, please ensure you are taking this as directed. If you do not have a history of high blood pressure and your blood pressure remains persistently elevated, you may need to begin taking a medication at some point. You may return here within the next few days to recheck if unable to see your primary care provider or if you do not have a one.  BP (!) 162/90 (BP Location: Left Arm)   Pulse 70   Temp 98.8 F (37.1 C) (Oral)   Resp 18   SpO2 98%   BP Readings from Last 3 Encounters:  08/06/20 (!) 162/90  07/28/18 (!) 157/97  04/19/18 (!) 184/97

## 2020-08-06 NOTE — ED Triage Notes (Signed)
Pt in with c/o rash that is on his left thigh that has spread to his buttocks and lower leg   Also c/o mass on his left hip that started around the same time he noticed the rash

## 2020-08-06 NOTE — ED Provider Notes (Addendum)
Miguel Mills   [id number] 08/06/20 Arrival Time: 0998  ASSESSMENT & PLAN:  1. Herpes zoster without complication   2. Elevated blood pressure reading without diagnosis of hypertension    Begin: Meds ordered this encounter  Medications   valACYclovir (VALTREX) 1000 MG tablet    Sig: Take 1 tablet (1,000 mg total) by mouth 3 (three) times daily.    Dispense:  21 tablet    Refill:  0     Discharge Instructions      Your blood pressure was noted to be elevated during your visit today. If you are currently taking medication for high blood pressure, please ensure you are taking this as directed. If you do not have a history of high blood pressure and your blood pressure remains persistently elevated, you may need to begin taking a medication at some point. You may return here within the next few days to recheck if unable to see your primary care provider or if you do not have a one.  BP (!) 162/90 (BP Location: Left Arm)   Pulse 70   Temp 98.8 F (37.1 C) (Oral)   Resp 18   SpO2 98%   BP Readings from Last 3 Encounters:  08/06/20 (!) 162/90  07/28/18 (!) 157/97  04/19/18 (!) 184/97          Follow-up Information     [facility] [place].   Specialty: Urgent Care Why: If worsening or failing to improve as anticipated. Contact information: [place] [phone]               Reviewed expectations re: course of current medical issues. Questions answered. Outlined signs and symptoms indicating need for more acute intervention. Patient verbalized understanding. After Visit Summary given.   SUBJECTIVE:  Miguel Mills is a 65 y.o. male who presents with a skin complaint. "Rash" on RLE; noted several days to one w ago. Slight discomfort. Afebrile. No h/o similar. No tx PTA. Increased blood pressure noted today. Reports that he has not been treated for hypertension in the past. He reports no  chest pain on exertion, no dyspnea on exertion, no swelling of ankles, no orthostatic dizziness or lightheadedness, no orthopnea or paroxysmal nocturnal dyspnea, no palpitations, and no intermittent claudication symptoms.   OBJECTIVE: Vitals:   08/06/20 1112  BP: (!) 162/90  Pulse: 70  Resp: 18  Temp: 98.8 F (37.1 C)  TempSrc: Oral  SpO2: 98%    General appearance: alert; no distress HEENT: [place]; AT Neck: supple with FROM Extremities: no edema; moves all extremities normally Skin: warm and dry; signs of infection: no; rops of red/purplish papules over S2 dermatome on the RLE; some crusting Psychological: alert and cooperative; normal mood and affect  No Known Allergies  Past Medical History:  Diagnosis Date   Retinal detachment    Social History   Socioeconomic History   Marital status: Divorced    Spouse name: Not on file   Number of children: Not on file   Years of education: Not on file   Highest education level: Not on file  Occupational History   Not on file  Tobacco Use   Smoking status: Every Day    Pack years: 0.00   Smokeless tobacco: Never  Substance and Sexual Activity   Alcohol use: Yes   Drug use: No   Sexual activity: Not on file  Other Topics Concern   Not on file  Social History Narrative  Not on file   Social Determinants of Health   Financial Resource Strain: Not on file  Food Insecurity: Not on file  Transportation Needs: Not on file  Physical Activity: Not on file  Stress: Not on file  Social Connections: Not on file  Intimate Partner Violence: Not on file   History reviewed. No pertinent family history. Past Surgical History:  Procedure Laterality Date   EYE SURGERY     LEG SURGERY        Vanessa Kick, MD 08/06/20 1121    Vanessa Kick, MD 08/06/20 1122

## 2020-10-30 ENCOUNTER — Encounter (HOSPITAL_COMMUNITY): Payer: Self-pay | Admitting: *Deleted

## 2020-10-30 ENCOUNTER — Other Ambulatory Visit: Payer: Self-pay

## 2020-10-30 ENCOUNTER — Ambulatory Visit (HOSPITAL_COMMUNITY)
Admission: EM | Admit: 2020-10-30 | Discharge: 2020-10-30 | Disposition: A | Discharge status: Home / Self Care | Payer: Managed Care, Other (non HMO) | Attending: Emergency Medicine | Admitting: Emergency Medicine

## 2020-10-30 DIAGNOSIS — R059 Cough, unspecified: Secondary | ICD-10-CM | POA: Diagnosis present

## 2020-10-30 DIAGNOSIS — U071 COVID-19: Secondary | ICD-10-CM | POA: Diagnosis present

## 2020-10-30 LAB — BASIC METABOLIC PANEL
Anion gap: 7 (ref 5–15)
BUN: 7 mg/dL — ABNORMAL LOW (ref 8–23)
CO2: 26 mmol/L (ref 22–32)
Calcium: 9.2 mg/dL (ref 8.9–10.3)
Chloride: 107 mmol/L (ref 98–111)
Creatinine, Ser: 0.75 mg/dL (ref 0.61–1.24)
GFR, Estimated: >60 mL/min (ref >60)
Glucose, Bld: 87 mg/dL (ref 70–99)
Potassium: 4.1 mmol/L (ref 3.5–5.1)
Sodium: 140 mmol/L (ref 135–145)

## 2020-10-30 MED ORDER — NIRMATRELVIR/RITONAVIR (PAXLOVID)TABLET: 3.0000 | ORAL_TABLET | Freq: Two times a day (BID) | ORAL | 0 refills | Status: AC

## 2020-10-30 NOTE — Discharge Instructions (Addendum)
Take all 3 pills together twice a day for the next 5 days beginning tomorrow.  Your lab work is pending, you will be contacted for any concerning values, if you do not hear anything from [place], everything is okay and you can continue use of medication  The CDC recommended quarantine for vaccinated people are next 5 days, your quarantine will end tomorrow therefore you may resume normal activity beginning Tuesday, if symptoms still present please wear facemask   Please attempt to refrain or decrease smoking while illness is present as this will make any symptoms that you have worse  At any point if you have difficulty breathing, increased shortness of breath please go to the nearest emergency department for evaluation  You can take Tylenol and/or Ibuprofen as needed for fever reduction and pain relief.   For cough: honey 1/2 to 1 teaspoon (you can dilute the honey in water or another fluid).  You can also use guaifenesin and dextromethorphan for cough. You can use a humidifier for chest congestion and cough.  If you don't have a humidifier, you can sit in the bathroom with the hot shower running.      For sore throat: try warm salt water gargles, cepacol lozenges, throat spray, warm tea or water with lemon/honey, popsicles or ice, or OTC cold relief medicine for throat discomfort.   For congestion: take a daily anti-histamine like Zyrtec, Claritin, and a oral decongestant, such as pseudoephedrine.  You can also use Flonase 1-2 sprays in each nostril daily.   It is important to stay hydrated: drink plenty of fluids (water, gatorade/powerade/pedialyte, juices, or teas) to keep your throat moisturized and help further relieve irritation/discomfort.

## 2020-10-30 NOTE — ED Triage Notes (Signed)
Pt reports exposure to Covid last week.  Pt states his only sxs is slight cough onset 3-4 days ago.  Reports +Covid test at [facility] yesterday.

## 2020-10-30 NOTE — ED Provider Notes (Signed)
[place]    CSN: [id number] Arrival date & time: 10/30/20  1109      History   Chief Complaint Chief Complaint  Patient presents with   Covid +   Cough    HPI Miguel Mills is a 65 y.o. male.   Patient presents with intermittent productive cough for 4 days.  COVID test from [facility] positive, no known sick contacts.  Denies fever, chills, body aches, ear pain or fullness, headaches, shortness of breath, wheezing, chest pain or tightness, abdominal pain, nausea, vomiting, diarrhea.  Has not attempted treatment.  No current medication use.  No cardiac or respiratory history.  Requesting antiviral treatment.  Past Medical History:  Diagnosis Date   Retinal detachment     Patient Active Problem List   Diagnosis Date Noted   TOBACCO USE 99991111   SYSTOLIC MURMUR 99991111   GERD 07/13/2008   BLOOD IN STOOL 07/13/2008   OTHER DYSPHAGIA 07/13/2008   DIARRHEA 07/13/2008   RHINITIS 06/25/2008   NIGHT SWEATS 06/25/2008   DYSPHAGIA UNSPECIFIED 06/25/2008   ELEVATED BP W/O HYPERTENSION 06/25/2008    Past Surgical History:  Procedure Laterality Date   EYE SURGERY     LEG SURGERY         Home Medications    Prior to Admission medications   Medication Sig Start Date End Date Taking? Authorizing Provider  fluticasone (FLONASE) 50 MCG/ACT nasal spray Place 2 sprays into both nostrils daily. 04/19/18  Yes Yu, Amy V, PA-C  nirmatrelvir/ritonavir EUA (PAXLOVID) 20 x 150 MG & 10 x '100MG'$  TABS Take 3 tablets by mouth 2 (two) times daily for 5 days. Patient GFR is 60. Take nirmatrelvir (150 mg) two tablets twice daily for 5 days and ritonavir (100 mg) one tablet twice daily for 5 days. 10/30/20 11/04/20 Yes Lovinia Snare R, NP  meloxicam (MOBIC) 7.5 MG tablet Take 1 tablet (7.5 mg total) by mouth daily. 04/19/18   Tasia Catchings, Amy V, PA-C  methocarbamol (ROBAXIN) 500 MG tablet Take 1 tablet (500 mg total) by mouth 2 (two) times daily. 04/19/18   Tasia Catchings, Amy V, PA-C   montelukast (SINGULAIR) 10 MG tablet Take 1 tablet (10 mg total) by mouth at bedtime. 06/05/16   Barnet Glasgow, NP  naproxen (NAPROSYN) 375 MG tablet Take 1 tablet (375 mg total) by mouth 2 (two) times daily with a meal. 07/28/18   Vanessa Kick, MD  valACYclovir (VALTREX) 1000 MG tablet Take 1 tablet (1,000 mg total) by mouth 3 (three) times daily. 08/06/20   Vanessa Kick, MD    Family History History reviewed. No pertinent family history.  Social History Social History   Tobacco Use   Smoking status: Every Day    Types: Cigarettes   Smokeless tobacco: Never  Vaping Use   Vaping Use: Never used  Substance Use Topics   Alcohol use: Yes    Comment: occasionally   Drug use: Never     Allergies   Patient has no known allergies.   Review of Systems Review of Systems  Constitutional: Negative.   HENT: Negative.    Respiratory:  Positive for cough. Negative for apnea, choking, chest tightness, shortness of breath, wheezing and stridor.   Cardiovascular: Negative.   Gastrointestinal: Negative.   Skin: Negative.   Neurological: Negative.     Physical Exam Triage Vital Signs ED Triage Vitals  Enc Vitals Group     BP 10/30/20 1259 (!) 186/93     Pulse Rate 10/30/20 1259 72  Resp 10/30/20 1259 16     Temp 10/30/20 1259 99 F (37.2 C)     Temp Source 10/30/20 1259 Oral     SpO2 10/30/20 1259 100 %     Weight --      Height --      Head Circumference --      Peak Flow --      Pain Score 10/30/20 1300 0     Pain Loc --      Pain Edu? --      Excl. in [place]? --    No data found.  Updated Vital Signs BP (!) 186/93   Pulse 72   Temp 99 F (37.2 C) (Oral)   Resp 16   SpO2 100%   Visual Acuity Right Eye Distance:   Left Eye Distance:   Bilateral Distance:    Right Eye Near:   Left Eye Near:    Bilateral Near:     Physical Exam Constitutional:      Appearance: Normal appearance. He is normal weight.  HENT:     Head: Normocephalic.     Right Ear:  Tympanic membrane, ear canal and external ear normal.     Left Ear: Tympanic membrane, ear canal and external ear normal.     Nose: Nose normal.     Mouth/Throat:     Mouth: Mucous membranes are moist.     Pharynx: Oropharynx is clear.  Eyes:     Extraocular Movements: Extraocular movements intact.     Conjunctiva/sclera: Conjunctivae normal.     Pupils: Pupils are equal, round, and reactive to light.  Cardiovascular:     Rate and Rhythm: Normal rate and regular rhythm.     Pulses: Normal pulses.     Heart sounds: Normal heart sounds.  Pulmonary:     Effort: Pulmonary effort is normal.     Breath sounds: Normal breath sounds.  Abdominal:     General: Abdomen is flat. Bowel sounds are normal.     Palpations: Abdomen is soft.  Musculoskeletal:     Cervical back: Normal range of motion and neck supple.  Skin:    General: Skin is warm and dry.  Neurological:     Mental Status: He is alert and oriented to person, place, and time. Mental status is at baseline.  Psychiatric:        Mood and Affect: Mood normal.        Behavior: Behavior normal.     UC Treatments / Results  Labs (all labs ordered are listed, but only abnormal results are displayed) Labs Reviewed  BASIC METABOLIC PANEL    EKG   Radiology No results found.  Procedures Procedures (including critical care time)  Medications Ordered in UC Medications - No data to display  Initial Impression / Assessment and Plan / UC Course  I have reviewed the triage vital signs and the nursing notes.  Pertinent labs & imaging results that were available during my care of the patient were reviewed by me and considered in my medical decision making (see chart for details).  COVID-19 Cough  Patient has no cardiac or respiratory history but due to age we will order antiviral treatment, last GFR in 2017 within normal range, will draw labs today, patient to start Paxil tomorrow, will notify of any concerning lab values,  declined prescription for cough medicine, exam unremarkable, blood pressure elevated, patient unsure if this is his baseline, no primary doctor, PCP referral placed for follow-up, patient is  every day smoker, advised abstinence or decrease use while viral illness present, given strict return precautions to go to nearest emergency department for worsening signs of infection or difficulty breathing at any point  Paxlovid twice a day for 5 days Final Clinical Impressions(s) / UC Diagnoses   Final diagnoses:  COVID-19  Cough     Discharge Instructions      Take all 3 pills together twice a day for the next 5 days beginning tomorrow.  Your lab work is pending, you will be contacted for any concerning values, if you do not hear anything from [place], everything is okay and you can continue use of medication  The CDC recommended quarantine for vaccinated people are next 5 days, your quarantine will end tomorrow therefore you may resume normal activity beginning Tuesday, if symptoms still present please wear facemask   Please attempt to refrain or decrease smoking while illness is present as this will make any symptoms that you have worse  At any point if you have difficulty breathing, increased shortness of breath please go to the nearest emergency department for evaluation  You can take Tylenol and/or Ibuprofen as needed for fever reduction and pain relief.   For cough: honey 1/2 to 1 teaspoon (you can dilute the honey in water or another fluid).  You can also use guaifenesin and dextromethorphan for cough. You can use a humidifier for chest congestion and cough.  If you don't have a humidifier, you can sit in the bathroom with the hot shower running.      For sore throat: try warm salt water gargles, cepacol lozenges, throat spray, warm tea or water with lemon/honey, popsicles or ice, or OTC cold relief medicine for throat discomfort.   For congestion: take a daily anti-histamine like Zyrtec,  Claritin, and a oral decongestant, such as pseudoephedrine.  You can also use Flonase 1-2 sprays in each nostril daily.   It is important to stay hydrated: drink plenty of fluids (water, gatorade/powerade/pedialyte, juices, or teas) to keep your throat moisturized and help further relieve irritation/discomfort.     ED Prescriptions     Medication Sig Dispense Auth. Provider   nirmatrelvir/ritonavir EUA (PAXLOVID) 20 x 150 MG & 10 x '100MG'$  TABS Take 3 tablets by mouth 2 (two) times daily for 5 days. Patient GFR is 60. Take nirmatrelvir (150 mg) two tablets twice daily for 5 days and ritonavir (100 mg) one tablet twice daily for 5 days. 30 tablet Hans Eden, NP      PDMP not reviewed this encounter.   Hans Eden, NP 10/30/20 1333

## 2021-01-10 ENCOUNTER — Ambulatory Visit: Payer: Self-pay

## 2021-01-10 ENCOUNTER — Other Ambulatory Visit: Payer: Self-pay

## 2021-01-10 ENCOUNTER — Other Ambulatory Visit: Payer: Self-pay | Admitting: Occupational Medicine

## 2021-01-10 DIAGNOSIS — M79672 Pain in left foot: Secondary | ICD-10-CM

## 2021-01-24 ENCOUNTER — Other Ambulatory Visit: Payer: Self-pay

## 2021-01-24 ENCOUNTER — Other Ambulatory Visit: Payer: Self-pay | Admitting: Family Medicine

## 2021-01-24 ENCOUNTER — Ambulatory Visit: Payer: Self-pay

## 2021-01-24 DIAGNOSIS — M25572 Pain in left ankle and joints of left foot: Secondary | ICD-10-CM

## 2021-03-07 ENCOUNTER — Other Ambulatory Visit: Payer: Self-pay

## 2021-03-07 ENCOUNTER — Encounter (HOSPITAL_COMMUNITY): Payer: Self-pay

## 2021-03-07 ENCOUNTER — Ambulatory Visit (HOSPITAL_COMMUNITY)
Admission: EM | Admit: 2021-03-07 | Discharge: 2021-03-07 | Disposition: A | Discharge status: Home / Self Care | Payer: Managed Care, Other (non HMO) | Attending: Urgent Care | Admitting: Urgent Care

## 2021-03-07 DIAGNOSIS — M25572 Pain in left ankle and joints of left foot: Secondary | ICD-10-CM

## 2021-03-07 DIAGNOSIS — M25472 Effusion, left ankle: Secondary | ICD-10-CM

## 2021-03-07 DIAGNOSIS — R030 Elevated blood-pressure reading, without diagnosis of hypertension: Secondary | ICD-10-CM | POA: Diagnosis not present

## 2021-03-07 DIAGNOSIS — I10 Essential (primary) hypertension: Secondary | ICD-10-CM

## 2021-03-07 DIAGNOSIS — M79672 Pain in left foot: Secondary | ICD-10-CM | POA: Diagnosis not present

## 2021-03-07 MED ORDER — LOSARTAN POTASSIUM 50 MG PO TABS: 50.0000 mg | ORAL_TABLET | Freq: Every day | ORAL | 0 refills | Status: DC

## 2021-03-07 MED ORDER — ACETAMINOPHEN 325 MG PO TABS: 650.0000 mg | ORAL_TABLET | Freq: Four times a day (QID) | ORAL | 0 refills | Status: DC | PRN

## 2021-03-07 MED ORDER — CEPHALEXIN 500 MG PO CAPS: 500.0000 mg | ORAL_CAPSULE | Freq: Three times a day (TID) | ORAL | 0 refills | Status: DC

## 2021-03-07 NOTE — ED Triage Notes (Signed)
Pt presents with left foot pain and swelling after an injury that occurred a few months ago. Pt has been seen since his injury and has had negative xray x2.

## 2021-03-07 NOTE — Discharge Instructions (Addendum)

## 2021-03-07 NOTE — ED Provider Notes (Signed)
[place]   MRN: [id number] DOB: [birth year]  Subjective:   Miguel Mills is a 66 y.o. male presenting for 2 to 28-month history of persistent intermittent left foot and ankle pain with swelling.  Patient has been seen twice for this and has had negative imaging except for mild arthritis of the left foot.  Denies any particular new fall, trauma, open wounds, drainage of pus or bleeding.  Regarding his blood pressure, denies any history of blood pressure issues, needing to take medications.  Currently, denies any headaches, confusion, vision changes, chest pain, nausea, vomiting, abdominal pain, hematuria, weakness, numbness or tingling.  Patient has longstanding history of smoking, currently does 1/2ppd.  He does not get regular follow-up with any PCP.  Does not take medications regularly.  No current facility-administered medications for this encounter.  Current Outpatient Medications:    fluticasone (FLONASE) 50 MCG/ACT nasal spray, Place 2 sprays into both nostrils daily., Disp: 15.8 g, Rfl: 2   meloxicam (MOBIC) 7.5 MG tablet, Take 1 tablet (7.5 mg total) by mouth daily. (Patient not taking: Reported on 03/07/2021), Disp: 15 tablet, Rfl: 0   methocarbamol (ROBAXIN) 500 MG tablet, Take 1 tablet (500 mg total) by mouth 2 (two) times daily. (Patient not taking: Reported on 03/07/2021), Disp: 20 tablet, Rfl: 0   montelukast (SINGULAIR) 10 MG tablet, Take 1 tablet (10 mg total) by mouth at bedtime. (Patient not taking: Reported on 03/07/2021), Disp: 30 tablet, Rfl: 2   naproxen (NAPROSYN) 375 MG tablet, Take 1 tablet (375 mg total) by mouth 2 (two) times daily with a meal. (Patient not taking: Reported on 03/07/2021), Disp: 14 tablet, Rfl: 0   valACYclovir (VALTREX) 1000 MG tablet, Take 1 tablet (1,000 mg total) by mouth 3 (three) times daily. (Patient not taking: Reported on 03/07/2021), Disp: 21 tablet, Rfl: 0   No Known Allergies  Past Medical History:  Diagnosis Date    Retinal detachment      Past Surgical History:  Procedure Laterality Date   EYE SURGERY     LEG SURGERY      History reviewed. No pertinent family history.  Social History   Tobacco Use   Smoking status: Every Day    Types: Cigarettes   Smokeless tobacco: Never  Vaping Use   Vaping Use: Never used  Substance Use Topics   Alcohol use: Yes    Comment: occasionally   Drug use: Never    ROS   Objective:   Vitals: BP (!) 204/95 (BP Location: Left Arm)    Pulse 96    Temp 98 F (36.7 C) (Oral)    Resp 14    SpO2 98%   BP Readings from Last 3 Encounters:  03/07/21 (!) 204/95  10/30/20 (!) 186/93  08/06/20 (!) 162/90   Physical Exam Constitutional:      General: He is not in acute distress.    Appearance: Normal appearance. He is well-developed. He is not ill-appearing, toxic-appearing or diaphoretic.  HENT:     Head: Normocephalic and atraumatic.     Right Ear: External ear normal.     Left Ear: External ear normal.     Nose: Nose normal.     Mouth/Throat:     Mouth: Mucous membranes are moist.  Eyes:     General: No scleral icterus.       Right eye: No discharge.        Left eye: No discharge.     Extraocular Movements: Extraocular movements intact.  Pupils: Pupils are equal, round, and reactive to light.  Cardiovascular:     Rate and Rhythm: Normal rate and regular rhythm.     Heart sounds: Normal heart sounds. No murmur heard.   No friction rub. No gallop.  Pulmonary:     Effort: Pulmonary effort is normal. No respiratory distress.     Breath sounds: Normal breath sounds. No stridor. No wheezing, rhonchi or rales.  Musculoskeletal:       Legs:     Comments: Sensation intact.  Neurological:     Mental Status: He is alert and oriented to person, place, and time.     Cranial Nerves: No cranial nerve deficit.     Motor: No weakness.     Coordination: Coordination normal.     Gait: Gait normal.     Comments: Negative Romberg and pronator drift.   Psychiatric:        Mood and Affect: Mood normal.        Behavior: Behavior normal.        Thought Content: Thought content normal.           Assessment and Plan :   PDMP not reviewed this encounter.  1. Left foot pain   2. Pain and swelling of left ankle   3. Essential hypertension   4. Elevated blood pressure reading    Patient has signs of lipodermatosclerosis and needs further evaluation through vascular vein specialist for rule out of peripheral vascular, peripheral arterial disease.  I suspect he has a secondary skin infection and will use Keflex for this.  Emphasized need to quit smoking.  Placed a referral to the vascular vein clinic.  Use Tylenol for pain control.  Regarding his blood pressure, no signs of an acute encephalopathy on exam.  Recommended starting losartan, practicing a low-sodium diet.  Establish care with a new PCP soon as possible. Counseled patient on potential for adverse effects with medications prescribed/recommended today, ER and return-to-clinic precautions discussed, patient verbalized understanding.    Jaynee Eagles, [place] 03/07/21 [phone]

## 2021-03-15 ENCOUNTER — Ambulatory Visit: Payer: Managed Care, Other (non HMO) | Admitting: Physician Assistant

## 2021-03-15 ENCOUNTER — Encounter: Payer: Self-pay | Admitting: Physician Assistant

## 2021-03-15 ENCOUNTER — Other Ambulatory Visit: Payer: Self-pay

## 2021-03-15 VITALS — BP 134/78 | HR 74 | Temp 98.1°F | Ht 68.0 in | Wt 163.8 lb

## 2021-03-15 DIAGNOSIS — I10 Essential (primary) hypertension: Secondary | ICD-10-CM

## 2021-03-15 DIAGNOSIS — M25472 Effusion, left ankle: Secondary | ICD-10-CM

## 2021-03-15 DIAGNOSIS — F17210 Nicotine dependence, cigarettes, uncomplicated: Secondary | ICD-10-CM

## 2021-03-15 DIAGNOSIS — M79672 Pain in left foot: Secondary | ICD-10-CM | POA: Diagnosis not present

## 2021-03-15 DIAGNOSIS — Z1211 Encounter for screening for malignant neoplasm of colon: Secondary | ICD-10-CM

## 2021-03-15 DIAGNOSIS — Z13220 Encounter for screening for lipoid disorders: Secondary | ICD-10-CM

## 2021-03-15 DIAGNOSIS — M25572 Pain in left ankle and joints of left foot: Secondary | ICD-10-CM | POA: Diagnosis not present

## 2021-03-15 DIAGNOSIS — R058 Other specified cough: Secondary | ICD-10-CM

## 2021-03-15 DIAGNOSIS — Z131 Encounter for screening for diabetes mellitus: Secondary | ICD-10-CM | POA: Diagnosis not present

## 2021-03-15 LAB — HEMOGLOBIN A1C: Hgb A1c MFr Bld: 5.5 % (ref 4.6–6.5)

## 2021-03-15 LAB — CBC WITH DIFFERENTIAL/PLATELET
Basophils Absolute: 0.1 10*3/uL (ref 0.0–0.1)
Basophils Relative: 1.2 % (ref 0.0–3.0)
Eosinophils Absolute: 0.1 10*3/uL (ref 0.0–0.7)
Eosinophils Relative: 2.1 % (ref 0.0–5.0)
HCT: 44.9 % (ref 39.0–52.0)
Hemoglobin: 14.8 g/dL (ref 13.0–17.0)
Lymphocytes Relative: 32.8 % (ref 12.0–46.0)
Lymphs Abs: 1.4 10*3/uL (ref 0.7–4.0)
MCHC: 33 g/dL (ref 30.0–36.0)
MCV: 93.3 fl (ref 78.0–100.0)
Monocytes Absolute: 0.6 10*3/uL (ref 0.1–1.0)
Monocytes Relative: 13.3 % — ABNORMAL HIGH (ref 3.0–12.0)
Neutro Abs: 2.2 10*3/uL (ref 1.4–7.7)
Neutrophils Relative %: 50.6 % (ref 43.0–77.0)
Platelets: 225 10*3/uL (ref 150.0–400.0)
RBC: 4.81 Mil/uL (ref 4.22–5.81)
RDW: 13.9 % (ref 11.5–15.5)
WBC: 4.4 10*3/uL (ref 4.0–10.5)

## 2021-03-15 LAB — LIPID PANEL
Cholesterol: 145 mg/dL (ref 0–200)
HDL: 61.8 mg/dL (ref >39.00)
LDL Cholesterol: 71 mg/dL (ref 0–99)
NonHDL: 83.07
Total CHOL/HDL Ratio: 2
Triglycerides: 60 mg/dL (ref 0.0–149.0)
VLDL: 12 mg/dL (ref 0.0–40.0)

## 2021-03-15 LAB — COMPREHENSIVE METABOLIC PANEL
ALT: 15 U/L (ref 0–53)
AST: 20 U/L (ref 0–37)
Albumin: 4.2 g/dL (ref 3.5–5.2)
Alkaline Phosphatase: 64 U/L (ref 39–117)
BUN: 8 mg/dL (ref 6–23)
CO2: 29 mEq/L (ref 19–32)
Calcium: 9.8 mg/dL (ref 8.4–10.5)
Chloride: 104 mEq/L (ref 96–112)
Creatinine, Ser: 0.8 mg/dL (ref 0.40–1.50)
GFR: 93.33 mL/min (ref >60.00)
Glucose, Bld: 89 mg/dL (ref 70–99)
Potassium: 4.4 mEq/L (ref 3.5–5.1)
Sodium: 139 mEq/L (ref 135–145)
Total Bilirubin: 0.7 mg/dL (ref 0.2–1.2)
Total Protein: 7.6 g/dL (ref 6.0–8.3)

## 2021-03-15 LAB — URIC ACID: Uric Acid, Serum: 5.3 mg/dL (ref 4.0–7.8)

## 2021-03-15 MED ORDER — LOSARTAN POTASSIUM 50 MG PO TABS: 50.0000 mg | ORAL_TABLET | Freq: Every day | ORAL | 0 refills | Status: DC
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
## 2021-03-15 NOTE — Patient Instructions (Addendum)
Good to meet you today! Please go to the lab for blood work and I will send results through [facility].  I have sent referral for screening colonoscopy.  [facility] to [facility] for [facility].  Continue on your Losartan 50 mg daily and monitor your blood pressure at home. Aim for a low salt diet. Keep working hard towards quitting smoking.   I'll see you back in 3 months. Call sooner if any concerns.
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
## 2021-03-16 ENCOUNTER — Other Ambulatory Visit: Payer: Self-pay

## 2021-03-16 ENCOUNTER — Ambulatory Visit (INDEPENDENT_AMBULATORY_CARE_PROVIDER_SITE_OTHER)
Admission: RE | Admit: 2021-03-16 | Discharge: 2021-03-16 | Disposition: A | Discharge status: Home / Self Care | Payer: Managed Care, Other (non HMO) | Source: Ambulatory Visit | Attending: Physician Assistant | Admitting: Physician Assistant

## 2021-03-16 DIAGNOSIS — I739 Peripheral vascular disease, unspecified: Secondary | ICD-10-CM

## 2021-03-16 DIAGNOSIS — R058 Other specified cough: Secondary | ICD-10-CM | POA: Diagnosis not present

## 2021-03-17 ENCOUNTER — Encounter: Payer: Self-pay | Admitting: Vascular Surgery

## 2021-03-17 ENCOUNTER — Other Ambulatory Visit (HOSPITAL_COMMUNITY): Payer: Self-pay | Admitting: Vascular Surgery

## 2021-03-17 ENCOUNTER — Ambulatory Visit: Payer: Managed Care, Other (non HMO) | Admitting: Vascular Surgery

## 2021-03-17 ENCOUNTER — Ambulatory Visit (INDEPENDENT_AMBULATORY_CARE_PROVIDER_SITE_OTHER)
Admission: RE | Admit: 2021-03-17 | Discharge: 2021-03-17 | Disposition: A | Discharge status: Home / Self Care | Payer: Managed Care, Other (non HMO) | Source: Ambulatory Visit | Attending: Vascular Surgery | Admitting: Vascular Surgery

## 2021-03-17 ENCOUNTER — Ambulatory Visit (HOSPITAL_COMMUNITY)
Admission: RE | Admit: 2021-03-17 | Discharge: 2021-03-17 | Disposition: A | Discharge status: Home / Self Care | Payer: Managed Care, Other (non HMO) | Source: Ambulatory Visit | Attending: Vascular Surgery | Admitting: Vascular Surgery

## 2021-03-17 VITALS — BP 155/86 | HR 70 | Temp 98.0°F | Resp 20 | Ht 68.0 in | Wt 166.0 lb

## 2021-03-17 DIAGNOSIS — M25572 Pain in left ankle and joints of left foot: Secondary | ICD-10-CM | POA: Diagnosis not present

## 2021-03-17 DIAGNOSIS — M7989 Other specified soft tissue disorders: Secondary | ICD-10-CM | POA: Diagnosis present

## 2021-03-17 DIAGNOSIS — I739 Peripheral vascular disease, unspecified: Secondary | ICD-10-CM | POA: Diagnosis present

## 2021-04-10 ENCOUNTER — Ambulatory Visit (HOSPITAL_COMMUNITY)
Admission: EM | Admit: 2021-04-10 | Discharge: 2021-04-10 | Disposition: A | Discharge status: Other Acute Inpatient Hospital | Payer: Managed Care, Other (non HMO) | Attending: Family Medicine | Admitting: Family Medicine

## 2021-04-10 ENCOUNTER — Ambulatory Visit (INDEPENDENT_AMBULATORY_CARE_PROVIDER_SITE_OTHER): Payer: Managed Care, Other (non HMO)

## 2021-04-10 ENCOUNTER — Encounter (HOSPITAL_COMMUNITY): Payer: Self-pay | Admitting: Emergency Medicine

## 2021-04-10 ENCOUNTER — Emergency Department (HOSPITAL_COMMUNITY): Payer: Managed Care, Other (non HMO)

## 2021-04-10 ENCOUNTER — Other Ambulatory Visit: Payer: Self-pay

## 2021-04-10 ENCOUNTER — Emergency Department (HOSPITAL_COMMUNITY)
Admission: EM | Admit: 2021-04-10 | Discharge: 2021-04-10 | Disposition: A | Discharge status: Home / Self Care | Payer: Managed Care, Other (non HMO) | Attending: Student | Admitting: Student

## 2021-04-10 ENCOUNTER — Encounter (HOSPITAL_COMMUNITY): Payer: Self-pay

## 2021-04-10 DIAGNOSIS — R1033 Periumbilical pain: Secondary | ICD-10-CM

## 2021-04-10 DIAGNOSIS — K529 Noninfective gastroenteritis and colitis, unspecified: Secondary | ICD-10-CM | POA: Diagnosis not present

## 2021-04-10 DIAGNOSIS — R112 Nausea with vomiting, unspecified: Secondary | ICD-10-CM

## 2021-04-10 DIAGNOSIS — I10 Essential (primary) hypertension: Secondary | ICD-10-CM | POA: Diagnosis not present

## 2021-04-10 DIAGNOSIS — R059 Cough, unspecified: Secondary | ICD-10-CM | POA: Diagnosis not present

## 2021-04-10 DIAGNOSIS — Z79899 Other long term (current) drug therapy: Secondary | ICD-10-CM | POA: Diagnosis not present

## 2021-04-10 DIAGNOSIS — R7309 Other abnormal glucose: Secondary | ICD-10-CM | POA: Diagnosis not present

## 2021-04-10 DIAGNOSIS — K567 Ileus, unspecified: Secondary | ICD-10-CM

## 2021-04-10 LAB — COMPREHENSIVE METABOLIC PANEL
ALT: 11 U/L (ref 0–44)
AST: 26 U/L (ref 15–41)
Albumin: 4.2 g/dL (ref 3.5–5.0)
Alkaline Phosphatase: 68 U/L (ref 38–126)
Anion gap: 10 (ref 5–15)
BUN: 10 mg/dL (ref 8–23)
CO2: 24 mmol/L (ref 22–32)
Calcium: 9.3 mg/dL (ref 8.9–10.3)
Chloride: 103 mmol/L (ref 98–111)
Creatinine, Ser: 0.85 mg/dL (ref 0.61–1.24)
GFR, Estimated: >60 mL/min (ref >60)
Glucose, Bld: 123 mg/dL — ABNORMAL HIGH (ref 70–99)
Potassium: 4.7 mmol/L (ref 3.5–5.1)
Sodium: 137 mmol/L (ref 135–145)
Total Bilirubin: 1.2 mg/dL (ref 0.3–1.2)
Total Protein: 7.8 g/dL (ref 6.5–8.1)

## 2021-04-10 LAB — CBC
HCT: 48.1 % (ref 39.0–52.0)
Hemoglobin: 16.5 g/dL (ref 13.0–17.0)
MCH: 31.3 pg (ref 26.0–34.0)
MCHC: 34.3 g/dL (ref 30.0–36.0)
MCV: 91.1 fL (ref 80.0–100.0)
Platelets: 315 10*3/uL (ref 150–400)
RBC: 5.28 MIL/uL (ref 4.22–5.81)
RDW: 13.3 % (ref 11.5–15.5)
WBC: 8.3 10*3/uL (ref 4.0–10.5)
nRBC: 0 % (ref 0.0–0.2)

## 2021-04-10 LAB — URINALYSIS, ROUTINE W REFLEX MICROSCOPIC
Bacteria, UA: NONE SEEN
Bilirubin Urine: NEGATIVE
Glucose, UA: NEGATIVE mg/dL
Hgb urine dipstick: NEGATIVE
Ketones, ur: 20 mg/dL — AB
Leukocytes,Ua: NEGATIVE
Nitrite: NEGATIVE
Protein, ur: 100 mg/dL — AB
Specific Gravity, Urine: 1.024 (ref 1.005–1.030)
pH: 6 (ref 5.0–8.0)

## 2021-04-10 LAB — LIPASE, BLOOD: Lipase: 29 U/L (ref 11–51)

## 2021-04-10 MED ORDER — ONDANSETRON 4 MG PO TBDP: 4.0000 mg | ORAL_TABLET | Freq: Three times a day (TID) | ORAL | 0 refills | Status: DC | PRN

## 2021-04-10 MED ORDER — SODIUM CHLORIDE 0.9 % IV BOLUS
1000.0000 mL | Freq: Once | INTRAVENOUS | Status: AC
  Administered 2021-04-10: 1000 mL via INTRAVENOUS

## 2021-04-10 MED ORDER — DICYCLOMINE HCL 20 MG PO TABS: 20.0000 mg | ORAL_TABLET | Freq: Two times a day (BID) | ORAL | 0 refills | Status: DC

## 2021-04-10 MED ORDER — DICYCLOMINE HCL 10 MG PO CAPS
10.0000 mg | ORAL_CAPSULE | Freq: Once | ORAL | Status: AC
  Administered 2021-04-10: 10 mg via ORAL
  Filled 2021-04-10: qty 1

## 2021-04-10 MED ORDER — ONDANSETRON HCL 4 MG/2ML IJ SOLN
4.0000 mg | Freq: Once | INTRAMUSCULAR | Status: AC
  Administered 2021-04-10: 4 mg via INTRAVENOUS
  Filled 2021-04-10: qty 2

## 2021-04-10 NOTE — Discharge Instructions (Signed)
Please read and follow all provided instructions.  Your diagnoses today include:  1. Periumbilical abdominal pain   2. Nausea and vomiting, unspecified vomiting type     TTests performed today include: Blood cell counts and platelets: Were normal Kidney and liver function tests Pancreas function test (called lipase) Urine test to look for infection CT scan of the abdomen pelvis: Showed inflammation of the small intestine, no bowel blockages Vital signs. See below for your results today.   Medications prescribed:  Bentyl - medication for intestinal cramps and spasms  Zofran (ondansetron) - for nausea and vomiting  Take any prescribed medications only as directed.  Home care instructions:  Follow any educational materials contained in this packet.  Keep drinking plenty of fluids and use the medicine for nausea as directed.   Drink clear liquids for the next 24 hours and introduce solid foods slowly after 24 hours using the b.r.a.t. diet (Bananas, Rice, Applesauce, Toast, Yogurt).    Follow-up instructions: Please follow-up with your primary care provider in the next 2 days for further evaluation of your symptoms. If you are not feeling better in 48 hours you may have a condition that is more serious and you need re-evaluation.   Return instructions:  SEEK IMMEDIATE MEDICAL ATTENTION IF: If you have pain that does not go away or becomes severe  A temperature above 101F develops  Repeated vomiting occurs (multiple episodes)  If you have pain that becomes localized to portions of the abdomen. The right side could possibly be appendicitis. In an adult, the left lower portion of the abdomen could be colitis or diverticulitis.  Blood is being passed in stools or vomit (bright red or black tarry stools)  You develop chest pain, difficulty breathing, dizziness or fainting, or become confused, poorly responsive, or inconsolable (young children) If you have any other emergent concerns  regarding your health  Additional Information: Abdominal (belly) pain can be caused by many things. Your caregiver performed an examination and possibly ordered blood/urine tests and imaging (CT scan, x-rays, ultrasound). Many cases can be observed and treated at home after initial evaluation in the emergency department. Even though you are being discharged home, abdominal pain can be unpredictable. Therefore, you need a repeated exam if your pain does not resolve, returns, or worsens. Most patients with abdominal pain don't have to be admitted to the hospital or have surgery, but serious problems like appendicitis and gallbladder attacks can start out as nonspecific pain. Many abdominal conditions cannot be diagnosed in one visit, so follow-up evaluations are very important.  Your vital signs today were: BP (!) 152/83    Pulse 75    Temp 98.1 F (36.7 C) (Oral)    Resp 12    SpO2 100%  If your blood pressure (bp) was elevated above 135/85 this visit, please have this repeated by your doctor within one month. --------------

## 2021-04-10 NOTE — ED Triage Notes (Signed)
Pt presents with c/o sharp stomach pain x 2 days.   States he has been coughing and phlegm that taste bitter.

## 2021-04-10 NOTE — ED Notes (Addendum)
Patient is being discharged from the Urgent Care and sent to the Emergency Department via POV . Per Dr.Banister, patient is in need of higher level of care due to possible illeus. Patient is aware and verbalizes understanding of plan of care.  Vitals:   04/10/21 1021  BP: (!) 167/97  Pulse: (!) 114  Resp: 15  Temp: 98.2 F (36.8 C)  SpO2: 100%

## 2021-04-10 NOTE — ED Triage Notes (Signed)
Patient here with complaint of intermittent abdominal pain since yesterday. Patient reports emesis twice today, reports constipation. Patient alert, oriented, and in no apparent distress at this time.

## 2021-04-10 NOTE — ED Provider Notes (Signed)
[facility] EMERGENCY DEPARTMENT Provider Note   CSN: [id number] Arrival date & time: 04/10/21  1152     History  Chief Complaint  Patient presents with   Abdominal Pain    Miguel Mills is a 66 y.o. male.  Patient with no past surgical history, hypertension, GERD --presents to the emergency department for 2 days of periumbilical abdominal pain and intermittent episodes of vomiting.  Patient last vomited twice this morning prior to ED arrival.  He had been constipated but does report 2 loose stools without blood during his ED wait.  No fevers or chills.  No chest pain or shortness of breath.  He denies dysuria, hematuria, increased frequency or urgency.  Pain is intermittent, above the umbilicus, coming less frequently now that he was able to have a bowel movement.      Home Medications Prior to Admission medications   Medication Sig Start Date End Date Taking? Authorizing Provider  acetaminophen (TYLENOL) 325 MG tablet Take 2 tablets (650 mg total) by mouth every 6 (six) hours as needed for moderate pain. 03/07/21   Jaynee Eagles, PA-C  cephALEXin (KEFLEX) 500 MG capsule Take 1 capsule (500 mg total) by mouth 3 (three) times daily. 03/07/21   Jaynee Eagles, PA-C  fluticasone (FLONASE) 50 MCG/ACT nasal spray Place 2 sprays into both nostrils daily. 04/19/18   Tasia Catchings, Amy V, PA-C  lansoprazole (PREVACID) 30 MG capsule Take 30 mg by mouth daily at 12 noon. PRN    [provider]  losartan (COZAAR) 50 MG tablet Take 1 tablet (50 mg total) by mouth daily. 03/15/21   Allwardt, Randa Evens, PA-C  meloxicam (MOBIC) 7.5 MG tablet Take 1 tablet (7.5 mg total) by mouth daily. 04/19/18   Tasia Catchings, Amy V, PA-C  montelukast (SINGULAIR) 10 MG tablet Take 1 tablet (10 mg total) by mouth at bedtime. 06/05/16   Barnet Glasgow, NP  naproxen (NAPROSYN) 375 MG tablet Take 1 tablet (375 mg total) by mouth 2 (two) times daily with a meal. Patient not taking: Reported on 03/15/2021 07/28/18    Vanessa Kick, MD  valACYclovir (VALTREX) 1000 MG tablet Take 1 tablet (1,000 mg total) by mouth 3 (three) times daily. Patient not taking: Reported on 03/15/2021 08/06/20   Vanessa Kick, MD      Allergies    Patient has no known allergies.    Review of Systems   Review of Systems  Physical Exam Updated Vital Signs BP (!) 168/88 (BP Location: Right Arm)    Pulse 99    Temp 98.1 F (36.7 C) (Oral)    Resp 17    SpO2 99%  Physical Exam Vitals and nursing note reviewed.  Constitutional:      General: He is not in acute distress.    Appearance: He is well-developed.  HENT:     Head: Normocephalic and atraumatic.  Eyes:     General:        Right eye: No discharge.        Left eye: No discharge.     Conjunctiva/sclera: Conjunctivae normal.  Cardiovascular:     Rate and Rhythm: Normal rate and regular rhythm.     Heart sounds: Normal heart sounds.  Pulmonary:     Effort: Pulmonary effort is normal.     Breath sounds: Normal breath sounds.  Abdominal:     Palpations: Abdomen is soft.     Tenderness: There is abdominal tenderness (Mild) in the epigastric area and periumbilical area.  Musculoskeletal:  Cervical back: Normal range of motion and neck supple.  Skin:    General: Skin is warm and dry.  Neurological:     Mental Status: He is alert.    ED Results / Procedures / Treatments   Labs (all labs ordered are listed, but only abnormal results are displayed) Labs Reviewed  COMPREHENSIVE METABOLIC PANEL - Abnormal; Notable for the following components:      Result Value   Glucose, Bld 123 (*)    All other components within normal limits  URINALYSIS, ROUTINE W REFLEX MICROSCOPIC - Abnormal; Notable for the following components:   APPearance HAZY (*)    Ketones, ur 20 (*)    Protein, ur 100 (*)    All other components within normal limits  LIPASE, BLOOD  CBC    EKG None  Radiology CT ABDOMEN PELVIS WO CONTRAST  Result Date: 04/10/2021 CLINICAL DATA:  Bowel  obstruction suspected.  Abdominal pain. EXAM: CT ABDOMEN AND PELVIS WITHOUT CONTRAST TECHNIQUE: Multidetector CT imaging of the abdomen and pelvis was performed following the standard protocol without IV contrast. RADIATION DOSE REDUCTION: This exam was performed according to the departmental dose-optimization program which includes automated exposure control, adjustment of the mA and/or kV according to patient size and/or use of iterative reconstruction technique. COMPARISON:  Abdominal radiographs 04/10/2021 FINDINGS: Lower chest: No basilar lung consolidation or pleural effusion. Normal heart size. Hepatobiliary: Mildly decreased attenuation of the liver diffusely suggesting steatosis. Unremarkable gallbladder. No biliary dilatation. Pancreas: Unremarkable. Spleen: Unremarkable. Adrenals/Urinary Tract: Unremarkable adrenal glands. No hydronephrosis. No renal mass identified on this unenhanced study. Punctate calcification in the right renal hilum which appears vascular. Circumferential bladder wall thickening with intermediate density fluid in the bladder. Stomach/Bowel: The stomach is unremarkable. There is a small duodenal diverticulum. There are multiple small bowel loops demonstrating wall thickening in the left mid and lower abdomen with mild surrounding inflammation. There is no evidence of bowel obstruction. Vascular/Lymphatic: Abdominal aortic atherosclerosis without aneurysm. No enlarged lymph nodes. Reproductive: Unremarkable prostate. Other: Small volume intraperitoneal free fluid, most notable in the left paracolic gutter region. Right inguinal hernia containing fat and a small amount of fluid. No pneumoperitoneum. Musculoskeletal: No acute osseous abnormality or suspicious osseous lesion. IMPRESSION: 1. Multiple thickened small bowel loops with mild adjacent inflammation and small volume free fluid compatible with enteritis. 2. Circumferential bladder wall thickening with intermediate density fluid  in the bladder, possibly cystitis with hemorrhage. Correlate with urinalysis. 3. Right inguinal hernia. 4. Hepatic steatosis. 5. Aortic Atherosclerosis (ICD10-I70.0). Electronically Signed   By: Logan Bores M.D.   On: 04/10/2021 15:02   DG Chest 2 View  Result Date: 04/10/2021 CLINICAL DATA:  Productive cough. EXAM: CHEST - 2 VIEW COMPARISON:  03/16/2021 FINDINGS: The heart size is normal. Tortuosity of thoracic aorta remains stable. Mild pulmonary hyperinflation again seen, suspicious for COPD. Both lungs are clear. The visualized skeletal structures are unremarkable. IMPRESSION: Probable COPD. No active cardiopulmonary disease. Electronically Signed   By: Marlaine Hind M.D.   On: 04/10/2021 11:22   DG Abd 2 Views  Result Date: 04/10/2021 CLINICAL DATA:  Periumbilical pain for 2 days EXAM: ABDOMEN - 2 VIEW COMPARISON:  None. FINDINGS: The bowel gas pattern is normal. There is no evidence of free air. No radio-opaque calculi or other significant radiographic abnormality is seen. No acute bony findings. IMPRESSION: Negative. Electronically Signed   By: Davina Poke D.O.   On: 04/10/2021 11:17    Procedures Procedures    Medications Ordered in ED  Medications  sodium chloride 0.9 % bolus 1,000 mL (has no administration in time range)  ondansetron (ZOFRAN) injection 4 mg (has no administration in time range)  dicyclomine (BENTYL) capsule 10 mg (has no administration in time range)    ED Course/ Medical Decision Making/ A&P    Patient seen and examined. History obtained directly from patient. Work-up including labs, imaging, EKG ordered in triage, if performed, were reviewed.    Labs/EKG: Independently reviewed and interpreted.  This included: CBC normal; CMP minimally elevated glucose; normal lipase; urine without definitive signs of infection.  Imaging: Independently reviewed and interpreted.  This included: CT of the abdomen and pelvis performed, agree signs of enteritis, no signs of  bowel obstruction.  Medications/Fluids: Ordered: IV fluid bolus, Zofran, p.o. Bentyl.   Most recent vital signs reviewed and are as follows: BP (!) 168/88 (BP Location: Right Arm)    Pulse 99    Temp 98.1 F (36.7 C) (Oral)    Resp 17    SpO2 99%   Initial impression: Abdominal pain, enteritis with vomiting and now loose stool.  Will treat symptoms here, advance to clear liquids.  Anticipate discharge to home if improving.  6:22 PM Reassessment performed. Patient appears well.   Reviewed additional pertinent lab work and imaging with patient at bedside including: Reviewed CT and blood work results again.  Most current vital signs reviewed and are as follows: BP (!) 152/83    Pulse 75    Temp 98.1 F (36.7 C) (Oral)    Resp 12    SpO2 100%   Plan: Discharge to home  Home treatment: Prescription written for Bentyl, Zofran.  Encourage liquid diet to bland diet over the next 24 to 48 hours.  The patient was urged to return to the Emergency Department immediately with worsening of current symptoms, worsening abdominal pain, persistent vomiting, blood noted in stools, fever, or any other concerns. The patient verbalized understanding.   Encourage PCP follow-up for recheck in 48 to 72 hours if not improving.                             Medical Decision Making Risk Prescription drug management.   For this patient's complaint of abdominal pain, the following conditions were considered on the differential diagnosis: gastritis/PUD, enteritis/duodenitis, appendicitis, cholelithiasis/cholecystitis, cholangitis, pancreatitis, ruptured viscus, colitis, diverticulitis, proctitis, cystitis, pyelonephritis, ureteral colic, aortic dissection, aortic aneurysm. In women, ectopic pregnancy, pelvic inflammatory disease, ovarian cysts, and tubo-ovarian abscess were also considered. Atypical chest etiologies were also considered including ACS, PE, and pneumonia.   CT suggestive of enteritis which is  consistent with the patient's symptoms.  Vomiting controlled with medication.  Patient is now tolerating fluids and Bentyl for abdominal spasms.  The patient's vital signs, pertinent lab work and imaging were reviewed and interpreted as discussed in the ED course. Hospitalization was considered for further testing, treatments, or serial exams/observation. However as patient is well-appearing, has a stable exam, and reassuring studies today, I do not feel that they warrant admission at this time. This plan was discussed with the patient who verbalizes agreement and comfort with this plan and seems reliable and able to return to the Emergency Department with worsening or changing symptoms.          Final Clinical Impression(s) / ED Diagnoses Final diagnoses:  Periumbilical abdominal pain  Nausea and vomiting, unspecified vomiting type  Enteritis    Rx / DC Orders ED Discharge Orders  Ordered    ondansetron (ZOFRAN-ODT) 4 MG disintegrating tablet  Every 8 hours PRN        04/10/21 1820    dicyclomine (BENTYL) 20 MG tablet  2 times daily        04/10/21 1820              Carlisle Cater, Hershal Coria 04/10/21 1824    Teressa Lower, MD 04/11/21 [phone]
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
## 2021-04-10 NOTE — Discharge Instructions (Addendum)
Your chest x-ray did not show any pneumonia or mass.  I do think you have some lung trouble like emphysema beginning  Your abdominal x-ray showed some signs that you may have something more urgent going on in your abdomen.  Please go to the emergency room so they can see you and possibly order advanced imaging like a CAT scan

## 2021-04-10 NOTE — ED Provider Triage Note (Signed)
Emergency Medicine Provider Triage Evaluation Note  Berkeley Vanaken , a 66 y.o. male  was evaluated in triage.  Pt complains of abdominal pain since yesterday morning.  Patient initially went over to urgent care, and they had concern for small bowel obstruction so they sent him to the emergency department for further labs and imaging.  Says he has not had a bowel movement in 2 days, and is not passing gas.  Does report 2 episodes of emesis this morning.  Review of Systems  Positive: Abdominal pain, nausea, vomiting, constipation Negative: Fever, chills, diarrhea  Physical Exam  BP (!) 179/104 (BP Location: Right Arm)    Pulse (!) 113    Temp 98.7 F (37.1 C) (Oral)    Resp 18    SpO2 99%  Gen:   Awake, no distress   Resp:  Normal effort  MSK:   Moves extremities without difficulty  Other:    Medical Decision Making  Medically screening exam initiated at 12:36 PM.  Appropriate orders placed.  Azariel Banik was informed that the remainder of the evaluation will be completed by another provider, this initial triage assessment does not replace that evaluation, and the importance of remaining in the ED until their evaluation is complete.  Labs and imaging ordered to r/o SBO   Oprah Camarena T, PA-C 04/10/21 1243

## 2021-04-13 ENCOUNTER — Other Ambulatory Visit: Payer: Self-pay

## 2021-04-13 ENCOUNTER — Ambulatory Visit: Payer: Managed Care, Other (non HMO) | Admitting: Physician Assistant

## 2021-04-13 VITALS — BP 159/83 | HR 70 | Temp 98.4°F | Ht 68.0 in | Wt 163.4 lb

## 2021-04-13 DIAGNOSIS — K529 Noninfective gastroenteritis and colitis, unspecified: Secondary | ICD-10-CM | POA: Diagnosis not present

## 2021-04-13 DIAGNOSIS — K219 Gastro-esophageal reflux disease without esophagitis: Secondary | ICD-10-CM

## 2021-04-13 DIAGNOSIS — I700 Atherosclerosis of aorta: Secondary | ICD-10-CM

## 2021-04-13 DIAGNOSIS — R9341 Abnormal radiologic findings on diagnostic imaging of renal pelvis, ureter, or bladder: Secondary | ICD-10-CM | POA: Diagnosis not present

## 2021-04-13 DIAGNOSIS — I10 Essential (primary) hypertension: Secondary | ICD-10-CM

## 2021-04-13 MED ORDER — OMEPRAZOLE 20 MG PO CPDR: 20.0000 mg | DELAYED_RELEASE_CAPSULE | Freq: Every day | ORAL | 0 refills | Status: DC
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
## 2021-04-13 NOTE — Patient Instructions (Addendum)
Track your blood pressure at home every few days and bring log to next appt. Continue on Losartan 50 mg.   Start on Omeprazole in the mornings before food to help with ?acid reflux.  Referral to urology for abnormal bladder wall thickening on CT.  Consider cholesterol medication in the future.

## 2021-04-24 ENCOUNTER — Encounter: Payer: Self-pay | Admitting: Physician Assistant

## 2021-04-24 ENCOUNTER — Ambulatory Visit: Payer: Managed Care, Other (non HMO) | Admitting: Physician Assistant

## 2021-04-24 VITALS — BP 136/60 | HR 80 | Ht 68.0 in | Wt 164.6 lb

## 2021-04-24 DIAGNOSIS — K760 Fatty (change of) liver, not elsewhere classified: Secondary | ICD-10-CM

## 2021-04-24 DIAGNOSIS — Z86010 Personal history of colonic polyps: Secondary | ICD-10-CM | POA: Diagnosis not present

## 2021-04-24 DIAGNOSIS — K219 Gastro-esophageal reflux disease without esophagitis: Secondary | ICD-10-CM | POA: Diagnosis not present

## 2021-04-24 MED ORDER — OMEPRAZOLE 40 MG PO CPDR: 40.0000 mg | DELAYED_RELEASE_CAPSULE | Freq: Every day | ORAL | 11 refills | Status: DC

## 2021-04-24 MED ORDER — NA SULFATE-K SULFATE-MG SULF 17.5-3.13-1.6 GM/177ML PO SOLN: 1.0000 | Freq: Once | ORAL | 0 refills | Status: AC

## 2021-04-24 NOTE — Patient Instructions (Signed)
If you are age 65 or younger, your body mass index should be between 19-25. Your Body mass index is 25.03 kg/m?Marland Kitchen If this is out of the aformentioned range listed, please consider follow up with your Primary Care Provider.  ?________________________________________________________ ? ?The [facility] providers would like to encourage you to use [facility] to communicate with providers for non-urgent requests or questions.  Due to long hold times on the telephone, sending your provider a message by [facility] may be a faster and more efficient way to get a response.  Please allow 48 business hours for a response.  Please remember that this is for non-urgent requests.  ?_______________________________________________________ ? ?You have been scheduled for an endoscopy and colonoscopy. Please follow the written instructions given to you at your visit today. ?Please pick up your prep supplies at the pharmacy within the next 1-3 days. ?If you use inhalers (even only as needed), please bring them with you on the day of your procedure. ? ?START Omeprazole 40 mg 1 capsule before dinner ? ?Follow GERD diet and precautions,. ?Decrease alcohol use. ? ?Follow up pending the results of your Endoscopy/Colonoscopy ? ?Thank you for entrusting me with your care and choosing [facility]. ? ?Nicoletta Ba, PA-C ?

## 2021-04-24 NOTE — Progress Notes (Signed)
? ?Subjective:  ? ? Patient ID: Miguel Mills, male    DOB: 02-Dec-1955, 66 y.o.   MRN: [id number] ? ?HPI ?Miguel Mills is a pleasant 66 year old African-American male, known remotely to Dr. Fuller Plan, who is referred today by primary care for evaluation of GERD symptoms ?Patient says he also knows that he is overdue for colonoscopy. ?He had last been seen here in 2010 when he underwent colonoscopy and EGD per Dr. Fuller Plan.  Colonoscopy revealed multiple colon polyps (10), largest was 10 mm in the cecum, all were removed and path showed tubular adenomas and hyperplastic polyps as well as finding of internal hemorrhoids.  He was indicated for 3-year interval follow-up. ?He says he is not having any problems with abdominal pain, no changes in bowel habits, no melena or hematochezia. ?He had been seen in the emergency room recently on 04/10/2021 with an acute episode of abdominal pain which was fairly intense and crampy in nature.  This lasted for couple of days and then resolved without any recurrence.  He did not have any associated nausea or vomiting, no diarrhea, no fever or chills. ?CT showed a normal-appearing gallbladder, hepatic steatosis, a right inguinal hernia and multiple thickened loops of small bowel consistent with an enteritis. ?Labs done in the ER with normal CBC, c-Met and lipase. ?Patient says his appetite is back to normal, and he has no complaints of abdominal discomfort. ?He has been bothered over the past year by intermittent acid reflux symptoms.  He says sometimes he may go as long as 2 weeks without any problems and then will have flareups.  Symptoms are usually present early in the morning with sensation of bitter sour brash.  He had been given a prescription for omeprazole 20 mg p.o. every morning, says that has helped a little bit but he is continuing to have symptoms.  No complaints of dysphagia. ? ?In regards to findings of hepatic steatosis on CT, again most recent LFTs were normal.  Patient does  have history of fairly regular EtOH use, socially though not on a daily basis. ? ?Review of Systems. Pertinent positive and negative review of systems were noted in the above HPI section.  All other review of systems was otherwise negative.  ? ?Outpatient Encounter Medications as of 04/24/2021  ?Medication Sig  ? fluticasone (FLONASE) 50 MCG/ACT nasal spray Place 2 sprays into both nostrils daily. (Patient taking differently: Place 2 sprays into both nostrils daily as needed for allergies.)  ? losartan (COZAAR) 50 MG tablet Take 1 tablet (50 mg total) by mouth daily.  ? Na Sulfate-K Sulfate-Mg Sulf 17.5-3.13-1.6 GM/177ML SOLN Take 1 kit by mouth once for 1 dose.  ? omeprazole (PRILOSEC) 40 MG capsule Take 1 capsule (40 mg total) by mouth daily before supper.  ? [DISCONTINUED] omeprazole (PRILOSEC) 20 MG capsule Take 1 capsule (20 mg total) by mouth daily.  ? [DISCONTINUED] dicyclomine (BENTYL) 20 MG tablet Take 1 tablet (20 mg total) by mouth 2 (two) times daily.  ? [DISCONTINUED] meloxicam (MOBIC) 7.5 MG tablet Take 1 tablet (7.5 mg total) by mouth daily.  ? [DISCONTINUED] montelukast (SINGULAIR) 10 MG tablet Take 1 tablet (10 mg total) by mouth at bedtime.  ? [DISCONTINUED] ondansetron (ZOFRAN-ODT) 4 MG disintegrating tablet Take 1 tablet (4 mg total) by mouth every 8 (eight) hours as needed for nausea or vomiting.  ? ?No facility-administered encounter medications on file as of 04/24/2021.  ? ?No Known Allergies ?Patient Active Problem List  ? Diagnosis Date Noted  ? TOBACCO  USE 07/22/2008  ? SYSTOLIC MURMUR 96/22/2979  ? GERD 07/13/2008  ? BLOOD IN STOOL 07/13/2008  ? OTHER DYSPHAGIA 07/13/2008  ? DIARRHEA 07/13/2008  ? RHINITIS 06/25/2008  ? NIGHT SWEATS 06/25/2008  ? DYSPHAGIA UNSPECIFIED 06/25/2008  ? ELEVATED BP W/O HYPERTENSION 06/25/2008  ? ?Social History  ? ?Socioeconomic History  ? Marital status: Divorced  ?  Spouse name: Not on file  ? Number of children: Not on file  ? Years of education: Not on file  ?  Highest education level: Not on file  ?Occupational History  ? Not on file  ?Tobacco Use  ? Smoking status: Every Day  ?  Packs/day: 0.50  ?  Years: 45.00  ?  Pack years: 22.50  ?  Types: Cigarettes  ? Smokeless tobacco: Never  ?Vaping Use  ? Vaping Use: Never used  ?Substance and Sexual Activity  ? Alcohol use: Yes  ?  Comment: occasionally  ? Drug use: Never  ? Sexual activity: Not on file  ?Other Topics Concern  ? Not on file  ?Social History Narrative  ? Lives with GF of 20 years. Two sons in [place] with children of their own.   ? ?Social Determinants of Health  ? ?Financial Resource Strain: Not on file  ?Food Insecurity: Not on file  ?Transportation Needs: Not on file  ?Physical Activity: Not on file  ?Stress: Not on file  ?Social Connections: Not on file  ?Intimate Partner Violence: Not on file  ? ? ?Mr. Spilker's family history includes COPD in his mother; Dementia in his father; Diabetes in his mother. ? ? ?   ?Objective:  ?  ?Vitals:  ? 04/24/21 1418  ?BP: 136/60  ?Pulse: 80  ? ? ?Physical Exam Well-developed well-nourished older African-American male in no acute distress.   Weight, 164 BMI 25.0 ? ?HEENT; nontraumatic normocephalic, EOMI, PE R LA, sclera anicteric. ?Oropharynx; not examined today ?Neck; supple, no JVD ?Cardiovascular; regular rate and rhythm with S1-S2, no murmur rub or gallop ?Pulmonary; Clear bilaterally ?Abdomen; soft, nontender, nondistended, no palpable mass or hepatosplenomegaly, bowel sounds are active ?Rectal; not done today ?Skin; benign exam, no jaundice rash or appreciable lesions ?Extremities; no clubbing cyanosis or edema skin warm and dry ?Neuro/Psych; alert and oriented x4, grossly nonfocal mood and affect appropriate  ? ? ? ?   ?Assessment & Plan:  ? ?#81 66 year old African-American male with history of multiple tubular adenomatous polyps and hyperplastic polyps at colonoscopy 2010, overdue for follow-up ? ?#2 recent ER visit with acute abdominal pain/cramping with CT  finding of multiple loops of thickened small bowel consistent with an enteritis. ?Abdomen is lasted for couple of days, and have completely resolved, no recurrence ?Suspect infectious ? ?#3 hepatic steatosis on CT-normal LFTs ?#4 right inguinal hernia ? ?#5 GERD-frequent early morning symptoms, no dysphagia, incomplete response to omeprazole 20 mg p.o. every morning ?Rule out hiatal hernia, rule out Barrett's ? ?Plan; patient will be scheduled for colonoscopy and EGD with Dr. Fuller Plan.  Both procedures were discussed in detail with patient including indications risk and benefits and he is agreeable to proceed. ?We discussed an antireflux regimen and antireflux diet and he was given copies of both.  Suggested n.p.o. for 2 to 3 hours prior to bedtime and elevation of the back 45 degrees for sleep. ?Will increase omeprazole to 40 mg, new prescription sent and have asked him to take this prior to dinner each day as the majority of his symptoms occur in the early morning hours. ?  We will need to monitor LFTs, and check abdominal ultrasound in 1 year.  We discussed decreasing EtOH use with finding of hepatic steatosis.  His BMI is 25 and he does not have comorbidities consistent with metabolic syndrome. ? ?Alfredia Ferguson PA-C ?04/24/2021 ? ? ?Cc: Allwardt, Alyssa M, PA-C ?  ?

## 2021-05-10 ENCOUNTER — Other Ambulatory Visit: Payer: Self-pay | Admitting: Physician Assistant

## 2021-05-10 NOTE — Telephone Encounter (Signed)
The original prescription was discontinued on 04/24/2021 by Aleatha Borer, CMA for the following reason: Dose change. Renewing this prescription may not be appropriate. ?

## 2021-05-11 ENCOUNTER — Encounter: Payer: Self-pay | Admitting: Physician Assistant

## 2021-05-11 ENCOUNTER — Ambulatory Visit: Payer: Managed Care, Other (non HMO) | Admitting: Physician Assistant

## 2021-05-11 VITALS — BP 192/90 | HR 74 | Temp 98.2°F | Ht 68.0 in | Wt 166.2 lb

## 2021-05-11 DIAGNOSIS — I10 Essential (primary) hypertension: Secondary | ICD-10-CM

## 2021-05-11 DIAGNOSIS — K219 Gastro-esophageal reflux disease without esophagitis: Secondary | ICD-10-CM

## 2021-05-11 DIAGNOSIS — R9341 Abnormal radiologic findings on diagnostic imaging of renal pelvis, ureter, or bladder: Secondary | ICD-10-CM | POA: Diagnosis not present

## 2021-05-11 MED ORDER — LOSARTAN POTASSIUM 100 MG PO TABS: 100.0000 mg | ORAL_TABLET | Freq: Every day | ORAL | 0 refills | Status: DC
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
## 2021-05-11 NOTE — Patient Instructions (Addendum)
Please increase losartan to 100 mg daily. ?Monitor readings and bring log to next appt. ?Try to quit smoking.  ?Reduce salt in diet. ?Drink plenty of water and walk daily. ? ? ?

## 2021-05-18 ENCOUNTER — Encounter: Payer: Self-pay | Admitting: Family

## 2021-05-18 ENCOUNTER — Ambulatory Visit: Payer: Managed Care, Other (non HMO) | Admitting: Family

## 2021-05-18 ENCOUNTER — Encounter: Payer: Self-pay | Admitting: Gastroenterology

## 2021-05-18 VITALS — BP 167/78 | HR 74 | Temp 98.6°F | Ht 68.0 in | Wt 165.1 lb

## 2021-05-18 DIAGNOSIS — I10 Essential (primary) hypertension: Secondary | ICD-10-CM | POA: Diagnosis not present

## 2021-05-18 MED ORDER — LOSARTAN POTASSIUM-HCTZ 50-12.5 MG PO TABS
1.0000 | ORAL_TABLET | Freq: Every morning | ORAL | 2 refills | Status: DC
Start: 2021-05-18 — End: 2021-06-19

## 2021-05-18 NOTE — Assessment & Plan Note (Signed)
Chronic - Losartan increased last visit to '100mg'$  qam. BP about the same, pt reports home readings 145-152/70-80s. Denies any sx.  ?Starting Losartan w/HCTZ 50-12.'5mg'$  qam, stopping '100mg'$  dose, adding '50mg'$  Losartan in evenings. pt to continue to monitor and let me know if still >130/90. f/u 1 month. ?

## 2021-05-18 NOTE — Patient Instructions (Addendum)
It was very nice to meet you today! ? ?As discussed, I am sending Hydrochlorothiazide to be added to your Losartan in the mornings and then you will take an extra '50mg'$  Losartan only in the evenings. ?Continue checking your blood pressure daily (just for the next 1-2 weeks) and let me know if it is still running higher than 130/90. ? ?Continue drinking 2 liters of water daily and eat a low sodium diet. ? ?Follow up in 1 mo. ? ? ? ?PLEASE NOTE: ? ?If you had any lab tests please let us know if you have not heard back within a few days. You may see your results on MyChart before we have a chance to review them but we will give you a call once they are reviewed by [place]. If we ordered any referrals today, please let us know if you have not heard from their office within the next week.  ? ?Please try these tips to maintain a healthy lifestyle: ? ?Eat most of your calories during the day when you are active. Eliminate processed foods including packaged sweets (pies, cakes, cookies), reduce intake of potatoes, white bread, white pasta, and white rice. Look for whole grain options, oat flour or almond flour. ? ?Each meal should contain half fruits/vegetables, one quarter protein, and one quarter carbs (no bigger than a computer mouse). ? ?Cut down on sweet beverages. This includes juice, soda, and sweet tea. Also watch fruit intake, though this is a healthier sweet option, it still contains natural sugar! Limit to 3 servings daily. ? ?Drink at least 1 glass of water with each meal and aim for at least 8 glasses per day ? ?Exercise at least 150 minutes every week.  ? ? ?
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
## 2021-05-24 ENCOUNTER — Ambulatory Visit (AMBULATORY_SURGERY_CENTER): Payer: Managed Care, Other (non HMO) | Admitting: Gastroenterology

## 2021-05-24 ENCOUNTER — Encounter: Payer: Self-pay | Admitting: Gastroenterology

## 2021-05-24 VITALS — BP 128/76 | HR 68 | Temp 97.8°F | Resp 17 | Ht 68.0 in | Wt 164.0 lb

## 2021-05-24 DIAGNOSIS — K635 Polyp of colon: Secondary | ICD-10-CM

## 2021-05-24 DIAGNOSIS — K449 Diaphragmatic hernia without obstruction or gangrene: Secondary | ICD-10-CM | POA: Diagnosis not present

## 2021-05-24 DIAGNOSIS — K31819 Angiodysplasia of stomach and duodenum without bleeding: Secondary | ICD-10-CM | POA: Diagnosis not present

## 2021-05-24 DIAGNOSIS — D125 Benign neoplasm of sigmoid colon: Secondary | ICD-10-CM

## 2021-05-24 DIAGNOSIS — K219 Gastro-esophageal reflux disease without esophagitis: Secondary | ICD-10-CM | POA: Diagnosis not present

## 2021-05-24 DIAGNOSIS — Z86010 Personal history of colonic polyps: Secondary | ICD-10-CM

## 2021-05-24 DIAGNOSIS — D123 Benign neoplasm of transverse colon: Secondary | ICD-10-CM

## 2021-05-24 MED ORDER — SODIUM CHLORIDE 0.9 % IV SOLN: 500.0000 mL | INTRAVENOUS | Status: DC

## 2021-05-24 NOTE — Op Note (Signed)
[place] ?Patient Name: Miguel Mills ?Procedure Date: 05/24/2021 2:08 PM ?MRN: [id number] ?Endoscopist: Ladene Artist , MD ?Age: 66 ?Referring MD:  ?Date of Birth: 06-Feb-1956 ?Gender: Male ?Account #: [id number] ?Procedure:                Colonoscopy ?Indications:              Surveillance: Personal history of adenomatous  ?                          polyps on last colonoscopy > 5 years ago ?Medicines:                Monitored Anesthesia Care ?Procedure:                Pre-Anesthesia Assessment: ?                          - Prior to the procedure, a History and Physical  ?                          was performed, and patient medications and  ?                          allergies were reviewed. The patient's tolerance of  ?                          previous anesthesia was also reviewed. The risks  ?                          and benefits of the procedure and the sedation  ?                          options and risks were discussed with the patient.  ?                          All questions were answered, and informed consent  ?                          was obtained. Prior Anticoagulants: The patient has  ?                          taken no previous anticoagulant or antiplatelet  ?                          agents. ASA Grade Assessment: II - A patient with  ?                          mild systemic disease. After reviewing the risks  ?                          and benefits, the patient was deemed in  ?                          satisfactory condition to undergo the procedure. ?  After obtaining informed consent, the colonoscope  ?                          was passed under direct vision. Throughout the  ?                          procedure, the patient's blood pressure, pulse, and  ?                          oxygen saturations were monitored continuously. The  ?                          Olympus CF-[id number] ([id number]) Colonoscope was  ?                          introduced through the anus  and advanced to the the  ?                          cecum, identified by appendiceal orifice and  ?                          ileocecal valve. The ileocecal valve, appendiceal  ?                          orifice, and rectum were photographed. The quality  ?                          of the bowel preparation was good. The colonoscopy  ?                          was performed without difficulty. The patient  ?                          tolerated the procedure well. ?Scope In: 2:15:26 PM ?Scope Out: 2:31:55 PM ?Scope Withdrawal Time: 0 hours 15 minutes 3 seconds  ?Total Procedure Duration: 0 hours 16 minutes 29 seconds  ?Findings:                 The perianal and digital rectal examinations were  ?                          normal. ?                          A single medium-sized localized angiodysplastic  ?                          lesion without bleeding was found in the ascending  ?                          colon. ?                          A 16 mm polyp was found in the transverse colon.  ?  The polyp was sessile. The polyp was removed with a  ?                          piecemeal technique using a hot snare. Resection  ?                          and retrieval were complete. ?                          Two sessile polyps were found in the sigmoid colon.  ?                          The polyps were 6 to 8 mm in size. These polyps  ?                          were removed with a cold snare. Resection and  ?                          retrieval were complete. ?                          Internal hemorrhoids were found during  ?                          retroflexion. The hemorrhoids were moderate and  ?                          Grade I (internal hemorrhoids that do not prolapse). ?                          The exam was otherwise without abnormality on  ?                          direct and retroflexion views. ?Complications:            No immediate complications. Estimated blood loss:  ?                           None. ?Estimated Blood Loss:     Estimated blood loss: none. 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 ?                          - One 16 mm polyp in the transverse colon, removed  ?                          piecemeal using a hot snare. Resected and retrieved. ?                          - Two 6 to 8 mm polyps in the sigmoid colon,  ?                          removed with a cold snare. Resected and retrieved. ?                          -  Internal hemorrhoids. ?                          - The examination was otherwise normal on direct  ?                          and retroflexion views. ?Recommendation:           - Repeat colonoscopy, likely 3 years, after studies  ?                          are complete for surveillance based on pathology  ?                          results. ?                          - Patient has a contact number available for  ?                          emergencies. The signs and symptoms of potential  ?                          delayed complications were discussed with the  ?                          patient. Return to normal activities tomorrow.  ?                          Written discharge instructions were provided to the  ?                          patient. ?                          - Resume previous diet. ?                          - Continue present medications. ?                          - Await pathology results. ?                          - No aspirin, ibuprofen, naproxen, or other  ?                          non-steroidal anti-inflammatory drugs for 2 weeks  ?                          after polyp removal. ?Ladene Artist, MD ?05/24/2021 2:45:49 PM ?This report has been signed electronically. ?

## 2021-05-24 NOTE — Progress Notes (Signed)
Called to room to assist during endoscopic procedure.  Patient ID and intended procedure confirmed with present staff. Received instructions for my participation in the procedure from the performing physician.  

## 2021-05-24 NOTE — Op Note (Signed)
[place] ?Patient Name: Miguel Mills ?Procedure Date: 05/24/2021 2:07 PM ?MRN: [id number] ?Endoscopist: Ladene Artist , MD ?Age: 66 ?Referring MD:  ?Date of Birth: November 19, 1955 ?Gender: Male ?Account #: [id number] ?Procedure:                Upper GI endoscopy ?Indications:              Gastroesophageal reflux disease ?Medicines:                Monitored Anesthesia Care ?Procedure:                Pre-Anesthesia Assessment: ?                          - Prior to the procedure, a History and Physical  ?                          was performed, and patient medications and  ?                          allergies were reviewed. The patient's tolerance of  ?                          previous anesthesia was also reviewed. The risks  ?                          and benefits of the procedure and the sedation  ?                          options and risks were discussed with the patient.  ?                          All questions were answered, and informed consent  ?                          was obtained. Prior Anticoagulants: The patient has  ?                          taken no previous anticoagulant or antiplatelet  ?                          agents. ASA Grade Assessment: II - A patient with  ?                          mild systemic disease. After reviewing the risks  ?                          and benefits, the patient was deemed in  ?                          satisfactory condition to undergo the procedure. ?                          After obtaining informed consent, the endoscope was  ?  passed under direct vision. Throughout the  ?                          procedure, the patient's blood pressure, pulse, and  ?                          oxygen saturations were monitored continuously. The  ?                          Endoscope was introduced through the mouth, and  ?                          advanced to the second part of duodenum. The upper  ?                          GI endoscopy was  accomplished without difficulty.  ?                          The patient tolerated the procedure well. ?Scope In: ?Scope Out: ?Findings:                 One benign-appearing, intrinsic mild stenosis was  ?                          found at the gastroesophageal junction. This  ?                          stenosis measured 1.6 cm (inner diameter) x less  ?                          than one cm (in length). The stenosis was traversed. ?                          The exam of the esophagus was otherwise normal. ?                          A small hiatal hernia was present. ?                          The exam of the stomach was otherwise normal. ?                          A single 3 mm angiodysplastic lesion without  ?                          bleeding was found in the duodenal bulb. ?                          The exam of the duodenum was otherwise normal. ?Complications:            No immediate complications. ?Estimated Blood Loss:     Estimated blood loss: none. ?Impression:               - Benign-appearing esophageal stenosis. ?                          -  Small hiatal hernia. ?                          - A single non-bleeding angiodysplastic lesion in  ?                          the duodenum. ?                          - No specimens collected. ?Recommendation:           - Patient has a contact number available for  ?                          emergencies. The signs and symptoms of potential  ?                          delayed complications were discussed with the  ?                          patient. Return to normal activities tomorrow.  ?                          Written discharge instructions were provided to the  ?                          patient. ?                          - Resume previous diet. ?                          - Follow antireflux measures. ?                          - Continue present medications. ?Ladene Artist, MD ?05/24/2021 2:48:53 PM ?This report has been signed electronically. ?

## 2021-05-24 NOTE — Progress Notes (Signed)
Pt non-responsive, VVS, Report to RN  °

## 2021-05-24 NOTE — Patient Instructions (Signed)
Handouts given on hemorrhoids and polyps. Await pathology results. ?Resume previous diet and continue present medications. ?Repeat colonoscopy likely in 3 years for surveillance. ?No aspirin, ibuprofen, naproxen, or other non-steroidal anti-inflammatory medications for 2 weeks following polyp removal. ? ? ?YOU HAD AN ENDOSCOPIC PROCEDURE TODAY AT [facility]:   Refer to the procedure report that was given to you for any specific questions about what was found during the examination.  If the procedure report does not answer your questions, please call your gastroenterologist to clarify.  If you requested that your care partner not be given the details of your procedure findings, then the procedure report has been included in a sealed envelope for you to review at your convenience later. ? ?YOU SHOULD EXPECT: Some feelings of bloating in the abdomen. Passage of more gas than usual.  Walking can help get rid of the air that was put into your GI tract during the procedure and reduce the bloating. If you had a lower endoscopy (such as a colonoscopy or flexible sigmoidoscopy) you may notice spotting of blood in your stool or on the toilet paper. If you underwent a bowel prep for your procedure, you may not have a normal bowel movement for a few days. ? ?Please Note:  You might notice some irritation and congestion in your nose or some drainage.  This is from the oxygen used during your procedure.  There is no need for concern and it should clear up in a day or so. ? ?SYMPTOMS TO REPORT IMMEDIATELY: ? ?Following lower endoscopy (colonoscopy or flexible sigmoidoscopy): ? Excessive amounts of blood in the stool ? Significant tenderness or worsening of abdominal pains ? Swelling of the abdomen that is new, acute ? Fever of 100?F or higher ? ?Following upper endoscopy (EGD) ? Vomiting of blood or coffee ground material ? New chest pain or pain under the shoulder blades ? Painful or persistently difficult  swallowing ? New shortness of breath ? Fever of 100?F or higher ? Black, tarry-looking stools ? ?For urgent or emergent issues, a gastroenterologist can be reached at any hour by calling [phone]. ?Do not use MyChart messaging for urgent concerns.  ? ? ?DIET:  We do recommend a small meal at first, but then you may proceed to your regular diet.  Drink plenty of fluids but you should avoid alcoholic beverages for 24 hours. ? ?ACTIVITY:  You should plan to take it easy for the rest of today and you should NOT DRIVE or use heavy machinery until tomorrow (because of the sedation medicines used during the test).   ? ?FOLLOW UP: ?Our staff will call the number listed on your records 48-72 hours following your procedure to check on you and address any questions or concerns that you may have regarding the information given to you following your procedure. If we do not reach you, we will leave a message.  We will attempt to reach you two times.  During this call, we will ask if you have developed any symptoms of COVID 19. If you develop any symptoms (ie: fever, flu-like symptoms, shortness of breath, cough etc.) before then, please call [phone].  If you test positive for Covid 19 in the 2 weeks post procedure, please call and report this information to [place].   ? ?If any biopsies were taken you will be contacted by phone or by letter within the next 1-3 weeks.  Please call us at [phone] if you have not heard about  the biopsies in 3 weeks.  ? ? ?SIGNATURES/CONFIDENTIALITY: ?You and/or your care partner have signed paperwork which will be entered into your electronic medical record.  These signatures attest to the fact that that the information above on your After Visit Summary has been reviewed and is understood.  Full responsibility of the confidentiality of this discharge information lies with you and/or your care-partner.  ?

## 2021-05-24 NOTE — Progress Notes (Signed)
See 04/24/2021 H&P, no changes.  ?

## 2021-05-29 ENCOUNTER — Telehealth: Payer: Self-pay | Admitting: *Deleted

## 2021-05-29 ENCOUNTER — Telehealth: Payer: Self-pay

## 2021-05-29 NOTE — Telephone Encounter (Signed)
?  Follow up Call- ? ? ?  05/24/2021  ?  1:15 PM  ?Call back number  ?Post procedure Call Back phone  # [phone]  ?Permission to leave phone message Yes  ?  ? ?Patient questions: ? ?Do you have a fever, pain , or abdominal swelling? No. ?Pain Score  0 * ? ?Have you tolerated food without any problems? Yes.   ? ?Have you been able to return to your normal activities? Yes.   ? ?Do you have any questions about your discharge instructions: ?Diet   No. ?Medications  No. ?Follow up visit  No. ? ?Do you have questions or concerns about your Care? No. ? ?Actions: ?* If pain score is 4 or above: ?No action needed, pain <4. ? ? ?

## 2021-05-29 NOTE — Telephone Encounter (Signed)
?  Follow up Call-First attempt for follow up phone call. No answer at number given.  Left message on voicemail.   ? ? ?  05/24/2021  ?  1:15 PM  ?Call back number  ?Post procedure Call Back phone  # [phone]  ?Permission to leave phone message Yes  ?  ?

## 2021-06-08 ENCOUNTER — Encounter: Payer: Self-pay | Admitting: Gastroenterology

## 2021-06-19 ENCOUNTER — Encounter: Payer: Self-pay | Admitting: Family

## 2021-06-19 ENCOUNTER — Ambulatory Visit: Payer: Managed Care, Other (non HMO) | Admitting: Family

## 2021-06-19 VITALS — BP 124/62 | HR 70 | Temp 98.2°F | Ht 68.0 in | Wt 165.4 lb

## 2021-06-19 DIAGNOSIS — I10 Essential (primary) hypertension: Secondary | ICD-10-CM | POA: Diagnosis not present

## 2021-06-19 MED ORDER — LOSARTAN POTASSIUM-HCTZ 50-12.5 MG PO TABS: 1.0000 | ORAL_TABLET | Freq: Every morning | ORAL | 1 refills | Status: DC

## 2021-06-19 MED ORDER — LOSARTAN POTASSIUM 50 MG PO TABS: 50.0000 mg | ORAL_TABLET | Freq: Every evening | ORAL | 1 refills | Status: DC
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
## 2021-06-19 NOTE — Assessment & Plan Note (Signed)
Chronic - stable- pt tolerating the HCTZ added to the Losartan in am, and taking '50mg'$  (1/2 of left over '100mg'$  pills) in the evenings. States all readings have been in normal range at home and at dentist office. Follow up in 6 mos for labs. ?

## 2021-06-19 NOTE — Patient Instructions (Signed)
It was very nice to see you today! ? ?I have sent a refill for your Losartan-HCTZ for 90 days to your pharmacy. ?Let us know when your '100mg'$  Losartan pills run out, and we will send in a refill for '50mg'$  for your evening dose. ? ?Continue to drink at least 2 liters of water daily and eat/drink a low sodium diet. ? ?Follow up in 6 mos or sooner if needed. ? ? ? ? ?PLEASE NOTE: ? ?If you had any lab tests please let us know if you have not heard back within a few days. You may see your results on MyChart before we have a chance to review them but we will give you a call once they are reviewed by [place]. If we ordered any referrals today, please let us know if you have not heard from their office within the next week.  ? ?

## 2021-10-11 ENCOUNTER — Other Ambulatory Visit: Payer: Self-pay | Admitting: Physician Assistant

## 2021-11-13 ENCOUNTER — Encounter: Payer: Self-pay | Admitting: *Deleted

## 2021-12-18 ENCOUNTER — Ambulatory Visit: Payer: Managed Care, Other (non HMO) | Admitting: Family

## 2022-01-15 ENCOUNTER — Encounter: Payer: Self-pay | Admitting: Family

## 2022-01-15 ENCOUNTER — Ambulatory Visit: Payer: Managed Care, Other (non HMO) | Admitting: Family

## 2022-01-15 VITALS — BP 127/82 | HR 108 | Temp 98.4°F | Ht 68.0 in | Wt 167.2 lb

## 2022-01-15 DIAGNOSIS — J3089 Other allergic rhinitis: Secondary | ICD-10-CM | POA: Diagnosis not present

## 2022-01-15 DIAGNOSIS — R053 Chronic cough: Secondary | ICD-10-CM

## 2022-01-15 DIAGNOSIS — I10 Essential (primary) hypertension: Secondary | ICD-10-CM | POA: Diagnosis not present

## 2022-01-15 LAB — LIPID PANEL
Cholesterol: 149 mg/dL (ref 0–200)
HDL: 61.8 mg/dL (ref >39.00)
LDL Cholesterol: 78 mg/dL (ref 0–99)
NonHDL: 87.01
Total CHOL/HDL Ratio: 2
Triglycerides: 43 mg/dL (ref 0.0–149.0)
VLDL: 8.6 mg/dL (ref 0.0–40.0)

## 2022-01-15 LAB — COMPREHENSIVE METABOLIC PANEL
ALT: 12 U/L (ref 0–53)
AST: 22 U/L (ref 0–37)
Albumin: 4.4 g/dL (ref 3.5–5.2)
Alkaline Phosphatase: 64 U/L (ref 39–117)
BUN: 9 mg/dL (ref 6–23)
CO2: 30 mEq/L (ref 19–32)
Calcium: 9.7 mg/dL (ref 8.4–10.5)
Chloride: 98 mEq/L (ref 96–112)
Creatinine, Ser: 1.06 mg/dL (ref 0.40–1.50)
GFR: 73.58 mL/min (ref >60.00)
Glucose, Bld: 91 mg/dL (ref 70–99)
Potassium: 4.8 mEq/L (ref 3.5–5.1)
Sodium: 135 mEq/L (ref 135–145)
Total Bilirubin: 0.6 mg/dL (ref 0.2–1.2)
Total Protein: 7.6 g/dL (ref 6.0–8.3)

## 2022-01-15 MED ORDER — LOSARTAN POTASSIUM-HCTZ 50-12.5 MG PO TABS: 1.0000 | ORAL_TABLET | Freq: Every morning | ORAL | 1 refills | Status: DC

## 2022-01-15 MED ORDER — HYDROCODONE BIT-HOMATROP MBR 5-1.5 MG/5ML PO SOLN: 5.0000 mL | Freq: Four times a day (QID) | ORAL | 0 refills | Status: DC | PRN

## 2022-01-15 MED ORDER — FLUTICASONE PROPIONATE 50 MCG/ACT NA SUSP: 1.0000 | Freq: Two times a day (BID) | NASAL | 1 refills | Status: DC

## 2022-01-15 NOTE — Patient Instructions (Signed)
It was very nice to see you today!   I will review your lab results via MyChart in a few days.  I sent in a refill of your Losartan-HCTZ. Continue taking every morning and the extra Losartan in the evening.  I have sent in cough syrup to help you sleep at night. Increase your Flonase to twice a day - remember to squirt, but do NOT sniff for best effectiveness. Use saline nasal spray prior to Flonase (ok to sniff saline spray) and during day as needed.  Have a wonderful holiday!    PLEASE NOTE:  If you had any lab tests please let us know if you have not heard back within a few days. You may see your results on MyChart before we have a chance to review them but we will give you a call once they are reviewed by [place]. If we ordered any referrals today, please let us know if you have not heard from their office within the next week.
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
## 2022-01-15 NOTE — Assessment & Plan Note (Signed)
chronic Losartan-HCTZ, 50-12.'5mg'$  & Losartan '50mg'$  qpm

## 2022-01-18 NOTE — Progress Notes (Signed)
Your labs are within normal range.  Glucose (sugar), electrolytes, kidney and liver function, and cholesterol numbers are all good.   Keep up the good work with controlling your diet and continue to try and shoot for 30 minutes of exercise daily!

## 2022-02-01 ENCOUNTER — Encounter: Payer: Self-pay | Admitting: *Deleted

## 2022-05-09 ENCOUNTER — Other Ambulatory Visit: Payer: Self-pay | Admitting: Physician Assistant

## 2022-05-09 MED ORDER — OMEPRAZOLE 40 MG PO CPDR: 40.0000 mg | DELAYED_RELEASE_CAPSULE | Freq: Every day | ORAL | 1 refills | Status: DC

## 2022-06-27 ENCOUNTER — Ambulatory Visit: Payer: Managed Care, Other (non HMO) | Admitting: Physician Assistant

## 2022-06-27 ENCOUNTER — Encounter: Payer: Self-pay | Admitting: Physician Assistant

## 2022-06-27 VITALS — BP 132/68 | HR 80 | Ht 68.0 in | Wt 163.0 lb

## 2022-06-27 DIAGNOSIS — K219 Gastro-esophageal reflux disease without esophagitis: Secondary | ICD-10-CM | POA: Diagnosis not present

## 2022-06-27 DIAGNOSIS — K760 Fatty (change of) liver, not elsewhere classified: Secondary | ICD-10-CM

## 2022-06-27 DIAGNOSIS — Z86010 Personal history of colonic polyps: Secondary | ICD-10-CM | POA: Diagnosis not present

## 2022-06-27 DIAGNOSIS — D126 Benign neoplasm of colon, unspecified: Secondary | ICD-10-CM | POA: Diagnosis not present

## 2022-06-27 MED ORDER — FAMOTIDINE 20 MG PO TABS: 20.0000 mg | ORAL_TABLET | Freq: Every day | ORAL | 4 refills | Status: AC

## 2022-06-27 MED ORDER — OMEPRAZOLE 40 MG PO CPDR: 40.0000 mg | DELAYED_RELEASE_CAPSULE | Freq: Every day | ORAL | 4 refills | Status: AC

## 2022-06-27 NOTE — Patient Instructions (Signed)
_______________________________________________________  If your blood pressure at your visit was 140/90 or greater, please contact your primary care physician to follow up on this. _______________________________________________________  If you are age 66 or older, your body mass index should be between 23-30. Your Body mass index is 24.78 kg/m. If this is out of the aforementioned range listed, please consider follow up with your Primary Care Provider. ________________________________________________________  The [facility] providers would like to encourage you to use [facility] to communicate with providers for non-urgent requests or questions.  Due to long hold times on the telephone, sending your provider a message by [facility] may be a faster and more efficient way to get a response.  Please allow 48 business hours for a response.  Please remember that this is for non-urgent requests.  _______________________________________________________  We have sent the following medications to your pharmacy for you to pick up at your convenience:  CONTINUE: Omeprazole 40mg  one capsule before breakfast meal each day. START: Pepcid 20mg  one every night at bedtime.  You have been scheduled for an abdominal ultrasound at [facility] (1st floor of hospital) on 07-05-22 at 10:00am. Please arrive 15 minutes prior to your appointment for registration. Make certain not to have anything to eat or drink after midnight the night prior to your appointment. Should you need to reschedule your appointment, please contact radiology at [phone]. This test typically takes about 30 minutes to perform.  Due to recent changes in healthcare laws, you may see the results of your imaging and laboratory studies on MyChart before your provider has had a chance to review them.  We understand that in some cases there may be results that are confusing or concerning to you. Not all laboratory results come back in the same  time frame and the provider may be waiting for multiple results in order to interpret others.  Please give [place] 48 hours in order for your provider to thoroughly review all the results before contacting the office for clarification of your results.   Please call our office or send at MyChart message to [place], LPN if Pepcid is not working.  Thank you for entrusting me with your care and choosing [facility].  Amy Esterwood, PA-C
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
## 2022-07-05 ENCOUNTER — Ambulatory Visit (HOSPITAL_COMMUNITY): Payer: Managed Care, Other (non HMO)

## 2022-07-13 ENCOUNTER — Ambulatory Visit (HOSPITAL_COMMUNITY)
Admission: RE | Admit: 2022-07-13 | Discharge: 2022-07-13 | Disposition: A | Discharge status: Home / Self Care | Payer: Managed Care, Other (non HMO) | Source: Ambulatory Visit | Attending: Physician Assistant | Admitting: Physician Assistant

## 2022-07-13 DIAGNOSIS — K219 Gastro-esophageal reflux disease without esophagitis: Secondary | ICD-10-CM | POA: Insufficient documentation

## 2022-07-13 DIAGNOSIS — D126 Benign neoplasm of colon, unspecified: Secondary | ICD-10-CM | POA: Insufficient documentation

## 2022-07-13 DIAGNOSIS — K760 Fatty (change of) liver, not elsewhere classified: Secondary | ICD-10-CM | POA: Insufficient documentation

## 2022-07-19 ENCOUNTER — Other Ambulatory Visit: Payer: Self-pay

## 2022-07-19 DIAGNOSIS — K760 Fatty (change of) liver, not elsewhere classified: Secondary | ICD-10-CM

## 2022-07-23 ENCOUNTER — Telehealth: Payer: Self-pay | Admitting: Family

## 2022-07-23 ENCOUNTER — Ambulatory Visit: Payer: Managed Care, Other (non HMO) | Admitting: Family

## 2022-07-23 VITALS — BP 130/78 | HR 88 | Temp 97.8°F | Ht 68.0 in | Wt 156.8 lb

## 2022-07-23 DIAGNOSIS — R040 Epistaxis: Secondary | ICD-10-CM | POA: Diagnosis not present

## 2022-07-23 DIAGNOSIS — I10 Essential (primary) hypertension: Secondary | ICD-10-CM | POA: Diagnosis not present

## 2022-07-23 NOTE — Telephone Encounter (Signed)
Prescription Request  07/23/2022  LOV: 07/23/2022  What is the name of the medication or equipment?  fluticasone (FLONASE) 50 MCG/ACT nasal spray     Have you contacted your pharmacy to request a refill? Yes   Which pharmacy would you like this sent to?  [facility] [phone] - [place], [place] - [address] AT [facility] [address] [place] [address] Phone: [phone] Fax: [phone]    Patient notified that their request is being sent to the clinical staff for review and that they should receive a response within 2 business days.   Please advise at Mobile [phone] (mobile)
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
## 2022-07-24 ENCOUNTER — Other Ambulatory Visit: Payer: Self-pay | Admitting: Family

## 2022-07-24 DIAGNOSIS — I10 Essential (primary) hypertension: Secondary | ICD-10-CM

## 2022-07-24 DIAGNOSIS — J3089 Other allergic rhinitis: Secondary | ICD-10-CM

## 2022-07-24 MED ORDER — LOSARTAN POTASSIUM-HCTZ 50-12.5 MG PO TABS
1.0000 | ORAL_TABLET | Freq: Every morning | ORAL | 1 refills | Status: DC
Start: 2022-07-24 — End: 2023-10-24

## 2022-07-24 MED ORDER — FLUTICASONE PROPIONATE 50 MCG/ACT NA SUSP
1.0000 | Freq: Two times a day (BID) | NASAL | 1 refills | Status: DC
Start: 2022-07-24 — End: 2022-08-29

## 2022-07-24 NOTE — Telephone Encounter (Signed)
Patient never had 6 month HTN f/u, seen yesterday for nose bleed. OK to refill? Please advise

## 2022-07-24 NOTE — Assessment & Plan Note (Signed)
chronic Losartan-HCTZ, 50-12.5mg  & Losartan 50mg  qpm BP doing well sending refills f/u 6 mos with fasting labs

## 2022-07-24 NOTE — Addendum Note (Signed)
Addended byDulce Sellar on: 07/24/2022 12:42 PM   Modules accepted: Orders

## 2022-07-24 NOTE — Telephone Encounter (Signed)
Refill sent to pharmacy.   

## 2022-07-28 ENCOUNTER — Ambulatory Visit (HOSPITAL_COMMUNITY)
Admission: RE | Admit: 2022-07-28 | Discharge: 2022-07-28 | Disposition: A | Discharge status: Home / Self Care | Payer: Managed Care, Other (non HMO) | Source: Ambulatory Visit | Attending: Physician Assistant | Admitting: Physician Assistant

## 2022-07-28 ENCOUNTER — Other Ambulatory Visit: Payer: Self-pay | Admitting: Physician Assistant

## 2022-07-28 DIAGNOSIS — K760 Fatty (change of) liver, not elsewhere classified: Secondary | ICD-10-CM | POA: Insufficient documentation

## 2022-07-28 MED ORDER — GADOBUTROL 1 MMOL/ML IV SOLN
7.0000 mL | Freq: Once | INTRAVENOUS | Status: AC | PRN
  Administered 2022-07-28: 7 mL via INTRAVENOUS

## 2022-08-06 ENCOUNTER — Telehealth: Payer: Self-pay | Admitting: Physician Assistant

## 2022-08-06 NOTE — Telephone Encounter (Signed)
Inbound call from patient stating he had reviewed his MRI results on Mychart and is requesting a call back to further discuss. Please advise.

## 2022-08-07 NOTE — Telephone Encounter (Signed)
Patient advised of results. Patient is relieved to know there were no acute findings and no masses of the liver. Reports he is doing well on omeprazole and famotidine.  See last office note.

## 2022-08-07 NOTE — Telephone Encounter (Signed)
Patient is aware the results have not been reviewed by the provider. Message to Dr Russella Dar asking for review and recommendations.

## 2022-08-09 ENCOUNTER — Telehealth: Payer: Self-pay | Admitting: Physician Assistant

## 2022-08-09 NOTE — Telephone Encounter (Signed)
Noted, thanks!

## 2022-08-09 NOTE — Telephone Encounter (Addendum)
Patient is returned your call and stated he has already spoken to someone regarding results.

## 2022-08-28 ENCOUNTER — Other Ambulatory Visit: Payer: Self-pay | Admitting: Family

## 2022-08-28 DIAGNOSIS — J3089 Other allergic rhinitis: Secondary | ICD-10-CM
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
## 2023-10-22 ENCOUNTER — Ambulatory Visit: Payer: Self-pay | Admitting: *Deleted

## 2023-10-22 NOTE — Telephone Encounter (Signed)
 Reviewed, scheduled for 9/4

## 2023-10-22 NOTE — Telephone Encounter (Signed)
 Copied from CRM [id number]. Topic: Clinical - Red Word Triage >> Oct 22, 2023  8:39 AM Tiffany S wrote: [facility] that prompted transfer to Nurse Triage: Pain in right side Reason for Disposition  [1] MODERATE pain (e.g., interferes with normal activities) AND [2] pain comes and goes (cramps) AND [3] present > 24 hours  (Exception: Pain with Vomiting or Diarrhea - see that Guideline.)  Answer Assessment - Initial Assessment Questions 1. LOCATION: Where does it hurt?      I'm having pain in right rib area.   I've had an injury there before.   It's not going away.   It's in the mornings when I wake up.  It's painful.  As the day goes on it lessens up.    I can feel it when I take a deep breath but it doesn't hurt.    Years ago I fell in a [place] and had bruises on my ribs.   I fell onto some wood.   I had fracture but no broken ribs.    2. RADIATION: Does the pain shoot anywhere else? (e.g., chest, back)     No 3. ONSET: When did the pain begin? (Minutes, hours or days ago)      About a month 4. SUDDEN: Gradual or sudden onset?     Just in the mornings. 5. PATTERN Does the pain come and go, or is it constant?     Constant in the mornings 6. SEVERITY: How bad is the pain?  (e.g., Scale 1-10; mild, moderate, or severe)     Worse in the mornings. 7. RECURRENT SYMPTOM: Have you ever had this type of stomach pain before? If Yes, ask: When was the last time? and What happened that time?      Yes 8. CAUSE: What do you think is causing the stomach pain? (e.g., gallstones, recent abdominal surgery)     I don't know 9. RELIEVING/AGGRAVATING FACTORS: What makes it better or worse? (e.g., antacids, bending or twisting motion, bowel movement)     Nothing 10. OTHER SYMPTOMS: Do you have any other symptoms? (e.g., back pain, diarrhea, fever, urination pain, vomiting)       No  Protocols used: Abdominal Pain - Male-A-AH FYI Only or Action Required?: FYI only  for provider.  Patient was last seen in primary care on 07/23/2022 by Lucius Krabbe, NP.  Called Nurse Triage reporting Abdominal Pain. Right sided under rib pain worse in the mornings Symptoms began about a month ago.  Interventions attempted: Nothing.  Symptoms are: gradually worsening.  Triage Disposition: See Physician Within 24 Hours  Patient/caregiver understands and will follow disposition?: Yes

## 2023-10-24 ENCOUNTER — Ambulatory Visit: Admitting: Family

## 2023-10-24 ENCOUNTER — Encounter: Payer: Self-pay | Admitting: Family

## 2023-10-24 VITALS — BP 169/94 | HR 103 | Temp 98.6°F | Ht 68.0 in | Wt 145.5 lb

## 2023-10-24 DIAGNOSIS — R0789 Other chest pain: Secondary | ICD-10-CM

## 2023-10-24 DIAGNOSIS — F17200 Nicotine dependence, unspecified, uncomplicated: Secondary | ICD-10-CM

## 2023-10-24 DIAGNOSIS — I10 Essential (primary) hypertension: Secondary | ICD-10-CM | POA: Diagnosis not present

## 2023-10-24 DIAGNOSIS — R4689 Other symptoms and signs involving appearance and behavior: Secondary | ICD-10-CM | POA: Insufficient documentation

## 2023-10-24 LAB — COMPREHENSIVE METABOLIC PANEL WITH GFR
ALT: 37 U/L (ref 0–53)
AST: 68 U/L — ABNORMAL HIGH (ref 0–37)
Albumin: 4.5 g/dL (ref 3.5–5.2)
Alkaline Phosphatase: 60 U/L (ref 39–117)
BUN: 10 mg/dL (ref 6–23)
CO2: 28 meq/L (ref 19–32)
Calcium: 9.7 mg/dL (ref 8.4–10.5)
Chloride: 103 meq/L (ref 96–112)
Creatinine, Ser: 0.86 mg/dL (ref 0.40–1.50)
GFR: 89.66 mL/min (ref >60.00)
Glucose, Bld: 80 mg/dL (ref 70–99)
Potassium: 4.8 meq/L (ref 3.5–5.1)
Sodium: 142 meq/L (ref 135–145)
Total Bilirubin: 0.9 mg/dL (ref 0.2–1.2)
Total Protein: 7.9 g/dL (ref 6.0–8.3)

## 2023-10-24 LAB — LIPID PANEL
Cholesterol: 139 mg/dL (ref 0–200)
HDL: 89.5 mg/dL (ref >39.00)
LDL Cholesterol: 43 mg/dL (ref 0–99)
NonHDL: 49.05
Total CHOL/HDL Ratio: 2
Triglycerides: 29 mg/dL (ref 0.0–149.0)
VLDL: 5.8 mg/dL (ref 0.0–40.0)

## 2023-10-24 MED ORDER — LOSARTAN POTASSIUM-HCTZ 50-12.5 MG PO TABS
1.0000 | ORAL_TABLET | Freq: Every morning | ORAL | 1 refills | Status: DC
Start: 2023-10-24 — End: 2023-11-25

## 2023-10-24 MED ORDER — LOSARTAN POTASSIUM 50 MG PO TABS
50.0000 mg | ORAL_TABLET | Freq: Every evening | ORAL | 1 refills | Status: DC
Start: 2023-10-24 — End: 2023-11-25

## 2023-10-24 MED ORDER — NAPROXEN 500 MG PO TABS
500.0000 mg | ORAL_TABLET | Freq: Two times a day (BID) | ORAL | 0 refills | Status: DC
Start: 2023-10-24 — End: 2023-12-11

## 2023-10-24 NOTE — Assessment & Plan Note (Signed)
 Hypertension poorly controlled due to inconsistent medication adherence. Potential for improved adherence with new job conditions. Reinforced health dangers with continued high BP along with still smoking. - Renew prescriptions for losartan -hydrochlorothiazide 50-12.5mg  qam and Losartan  50mg  qpm. - Order labs for kidney, liver function, and cholesterol levels. - Continue to advise on 2L water qd, increase cardio exercise and low sodium diet. - Schedule follow-up in one month.

## 2023-10-24 NOTE — Patient Instructions (Addendum)
 It was very nice to see you today!   I will review your lab results via MyChart in a few days.  Apply ice up to 20 minutes to your right rib cage 2-3 times per day for the next week, then apply heat for same amount of time until feeling better. I have sent Naproxen  to take for 1 week to help with the pain.  You have to get your blood pressure under control! Take the Losartan -HCTZ (has the fluid pill) in the morning and then the Losartan  by itself at supper time. Drink at least 2 liters of water every day and eat a low salt diet- reduce any fast food or restaurant food.  I need you to please come back in 1 month and be sure to take the medicine BEFORE you come so I can see that it is working!      PLEASE NOTE:  If you had any lab tests please let us  know if you have not heard back within a few days. You may see your results on MyChart before we have a chance to review them but we will give you a call once they are reviewed by us . If we ordered any referrals today, please let us  know if you have not heard from their office within the next week.

## 2023-10-24 NOTE — Assessment & Plan Note (Signed)
 Smoking reduced to two cigarettes daily due to job restrictions. Complete cessation needed to lower cardiovascular risk. - Encourage complete smoking cessation. - Discuss potential use of Chantix. - Reinforce benefits of quitting smoking.

## 2023-10-24 NOTE — Assessment & Plan Note (Addendum)
 Not taking BP meds as directed or keeping f/u appts.
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
## 2023-10-25 ENCOUNTER — Telehealth: Payer: Self-pay

## 2023-10-25 ENCOUNTER — Ambulatory Visit: Payer: Self-pay | Admitting: Family

## 2023-10-25 NOTE — Telephone Encounter (Signed)
 I called and spoke with pharmacy in regards, no change of Rx's at this time.    Copied from CRM [id number]. Topic: Clinical - Medication Question >> Oct 24, 2023 11:21 AM Anairis L wrote: Reason for CRM: Corean from [place] is calling wanting clarification on Losartan  sent twice once at 50mg  and 50-12.5. She also confirmed there is a 100 mg tablet.   Please call back at [phone]  Thank you.

## 2023-11-25 ENCOUNTER — Ambulatory Visit: Admitting: Family

## 2023-11-25 ENCOUNTER — Encounter: Payer: Self-pay | Admitting: Family

## 2023-11-25 VITALS — BP 114/80 | HR 75 | Temp 97.9°F | Ht 68.0 in | Wt 150.2 lb

## 2023-11-25 DIAGNOSIS — I10 Essential (primary) hypertension: Secondary | ICD-10-CM | POA: Diagnosis not present

## 2023-11-25 DIAGNOSIS — Z23 Encounter for immunization: Secondary | ICD-10-CM | POA: Diagnosis not present

## 2023-11-25 DIAGNOSIS — F17200 Nicotine dependence, unspecified, uncomplicated: Secondary | ICD-10-CM | POA: Diagnosis not present

## 2023-11-25 MED ORDER — LOSARTAN POTASSIUM-HCTZ 50-12.5 MG PO TABS
1.0000 | ORAL_TABLET | Freq: Every morning | ORAL | 5 refills | Status: AC
Start: 2023-11-25 — End: ?

## 2023-11-25 MED ORDER — LOSARTAN POTASSIUM 50 MG PO TABS
50.0000 mg | ORAL_TABLET | Freq: Every evening | ORAL | 5 refills | Status: AC
Start: 2023-11-25 — End: ?

## 2023-11-25 NOTE — Assessment & Plan Note (Signed)
 Hypertension well-controlled with resumed medication adherence. Blood pressure readings improved.  - EMR states 90d refill [facility] go to [facility], otherwise 30-day supply goes to [facility]. - Instructed to monitor blood pressure at home if symptoms occur and report if consistently above 135/90 mmHg. - ADvised to Consider using [facility] pharmacy with free delivery if insurance will allow 90d refills. - Schedule follow-up in 6 months to reassess blood pressure control and medication needs.
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
## 2023-11-25 NOTE — Assessment & Plan Note (Signed)
 Smoking reduced to two cigarettes daily due to job restrictions. Complete cessation needed to lower cardiovascular risk. - Encourage complete smoking cessation. - Discuss potential use of Chantix. - Reinforce benefits of quitting smoking.

## 2023-12-10 ENCOUNTER — Other Ambulatory Visit: Payer: Self-pay | Admitting: Family

## 2023-12-10 DIAGNOSIS — R0789 Other chest pain: Secondary | ICD-10-CM

## 2024-05-28 ENCOUNTER — Ambulatory Visit: Admitting: Family
